# Patient Record
Sex: Female | Born: 1955
Health system: Southern US, Community
[De-identification: ages and names within clinical notes are randomized; demographics above are authoritative.]

## PROBLEM LIST (undated history)

## (undated) DIAGNOSIS — T8859XA Other complications of anesthesia, initial encounter: Secondary | ICD-10-CM

## (undated) DIAGNOSIS — R112 Nausea with vomiting, unspecified: Secondary | ICD-10-CM

## (undated) DIAGNOSIS — Z973 Presence of spectacles and contact lenses: Secondary | ICD-10-CM

## (undated) DIAGNOSIS — F419 Anxiety disorder, unspecified: Secondary | ICD-10-CM

## (undated) DIAGNOSIS — I341 Nonrheumatic mitral (valve) prolapse: Secondary | ICD-10-CM

## (undated) DIAGNOSIS — I10 Essential (primary) hypertension: Secondary | ICD-10-CM

## (undated) DIAGNOSIS — G44009 Cluster headache syndrome, unspecified, not intractable: Secondary | ICD-10-CM

## (undated) DIAGNOSIS — K589 Irritable bowel syndrome without diarrhea: Secondary | ICD-10-CM

## (undated) DIAGNOSIS — C801 Malignant (primary) neoplasm, unspecified: Secondary | ICD-10-CM

## (undated) DIAGNOSIS — M1990 Unspecified osteoarthritis, unspecified site: Secondary | ICD-10-CM

## (undated) DIAGNOSIS — T4145XA Adverse effect of unspecified anesthetic, initial encounter: Secondary | ICD-10-CM

## (undated) DIAGNOSIS — Z9889 Other specified postprocedural states: Secondary | ICD-10-CM

## (undated) HISTORY — DX: Nonrheumatic mitral (valve) prolapse: I34.1

## (undated) HISTORY — DX: Malignant (primary) neoplasm, unspecified: C80.1

## (undated) HISTORY — DX: Presence of spectacles and contact lenses: Z97.3

## (undated) HISTORY — DX: Unspecified osteoarthritis, unspecified site: M19.90

## (undated) HISTORY — DX: Cluster headache syndrome, unspecified, not intractable: G44.009

## (undated) HISTORY — DX: Irritable bowel syndrome without diarrhea: K58.9

---

## 1998-03-07 HISTORY — PX: VAGINAL HYSTERECTOMY: SHX2639

## 1998-03-18 ENCOUNTER — Encounter: Payer: Self-pay | Admitting: Gynecology

## 1998-03-23 ENCOUNTER — Inpatient Hospital Stay (HOSPITAL_COMMUNITY): Admission: RE | Admit: 1998-03-23 | Discharge: 1998-03-24 | Payer: Self-pay | Admitting: Gynecology

## 1998-12-08 ENCOUNTER — Other Ambulatory Visit: Admission: RE | Admit: 1998-12-08 | Discharge: 1998-12-08 | Payer: Self-pay | Admitting: Gynecology

## 1999-05-26 ENCOUNTER — Encounter: Admission: RE | Admit: 1999-05-26 | Discharge: 1999-05-26 | Payer: Self-pay | Admitting: Gynecology

## 1999-05-26 ENCOUNTER — Encounter: Payer: Self-pay | Admitting: Gynecology

## 2000-01-25 ENCOUNTER — Other Ambulatory Visit: Admission: RE | Admit: 2000-01-25 | Discharge: 2000-01-25 | Payer: Self-pay | Admitting: Gynecology

## 2000-08-31 ENCOUNTER — Encounter: Admission: RE | Admit: 2000-08-31 | Discharge: 2000-08-31 | Payer: Self-pay | Admitting: Family Medicine

## 2000-08-31 ENCOUNTER — Encounter: Payer: Self-pay | Admitting: Family Medicine

## 2000-09-29 ENCOUNTER — Encounter: Payer: Self-pay | Admitting: Family Medicine

## 2000-09-29 ENCOUNTER — Encounter: Admission: RE | Admit: 2000-09-29 | Discharge: 2000-09-29 | Payer: Self-pay | Admitting: Family Medicine

## 2000-11-02 ENCOUNTER — Encounter: Admission: RE | Admit: 2000-11-02 | Discharge: 2000-11-02 | Payer: Self-pay | Admitting: Family Medicine

## 2000-11-02 ENCOUNTER — Encounter: Payer: Self-pay | Admitting: Family Medicine

## 2001-02-07 ENCOUNTER — Other Ambulatory Visit: Admission: RE | Admit: 2001-02-07 | Discharge: 2001-02-07 | Payer: Self-pay | Admitting: Gynecology

## 2002-04-29 ENCOUNTER — Encounter: Admission: RE | Admit: 2002-04-29 | Discharge: 2002-04-29 | Payer: Self-pay | Admitting: Family Medicine

## 2002-04-29 ENCOUNTER — Encounter: Payer: Self-pay | Admitting: Family Medicine

## 2002-05-06 ENCOUNTER — Encounter: Payer: Self-pay | Admitting: Family Medicine

## 2002-05-06 ENCOUNTER — Encounter: Admission: RE | Admit: 2002-05-06 | Discharge: 2002-05-06 | Payer: Self-pay | Admitting: Family Medicine

## 2002-08-28 ENCOUNTER — Other Ambulatory Visit: Admission: RE | Admit: 2002-08-28 | Discharge: 2002-08-28 | Payer: Self-pay | Admitting: Gynecology

## 2003-03-08 HISTORY — PX: BREAST LUMPECTOMY: SHX2

## 2003-06-27 ENCOUNTER — Emergency Department (HOSPITAL_COMMUNITY): Admission: EM | Admit: 2003-06-27 | Discharge: 2003-06-27 | Payer: Self-pay | Admitting: Emergency Medicine

## 2003-12-31 ENCOUNTER — Other Ambulatory Visit: Admission: RE | Admit: 2003-12-31 | Discharge: 2003-12-31 | Payer: Self-pay | Admitting: Gynecology

## 2004-01-22 ENCOUNTER — Encounter: Admission: RE | Admit: 2004-01-22 | Discharge: 2004-01-22 | Payer: Self-pay | Admitting: Gynecology

## 2004-02-10 ENCOUNTER — Encounter (INDEPENDENT_AMBULATORY_CARE_PROVIDER_SITE_OTHER): Payer: Self-pay | Admitting: Diagnostic Radiology

## 2004-02-10 ENCOUNTER — Encounter: Admission: RE | Admit: 2004-02-10 | Discharge: 2004-02-10 | Payer: Self-pay | Admitting: Gynecology

## 2004-02-10 ENCOUNTER — Encounter (INDEPENDENT_AMBULATORY_CARE_PROVIDER_SITE_OTHER): Payer: Self-pay | Admitting: Specialist

## 2004-02-13 ENCOUNTER — Encounter (HOSPITAL_COMMUNITY): Admission: RE | Admit: 2004-02-13 | Discharge: 2004-05-13 | Payer: Self-pay | Admitting: General Surgery

## 2004-02-25 ENCOUNTER — Encounter: Admission: RE | Admit: 2004-02-25 | Discharge: 2004-02-25 | Payer: Self-pay | Admitting: General Surgery

## 2004-02-26 ENCOUNTER — Ambulatory Visit (HOSPITAL_COMMUNITY): Admission: RE | Admit: 2004-02-26 | Discharge: 2004-02-26 | Payer: Self-pay | Admitting: General Surgery

## 2004-02-26 ENCOUNTER — Encounter (INDEPENDENT_AMBULATORY_CARE_PROVIDER_SITE_OTHER): Payer: Self-pay | Admitting: Specialist

## 2004-02-26 ENCOUNTER — Ambulatory Visit (HOSPITAL_BASED_OUTPATIENT_CLINIC_OR_DEPARTMENT_OTHER): Admission: RE | Admit: 2004-02-26 | Discharge: 2004-02-26 | Payer: Self-pay | Admitting: General Surgery

## 2004-02-26 ENCOUNTER — Encounter (INDEPENDENT_AMBULATORY_CARE_PROVIDER_SITE_OTHER): Payer: Self-pay | Admitting: General Surgery

## 2004-03-02 ENCOUNTER — Ambulatory Visit: Payer: Self-pay | Admitting: Oncology

## 2004-03-22 ENCOUNTER — Ambulatory Visit (HOSPITAL_BASED_OUTPATIENT_CLINIC_OR_DEPARTMENT_OTHER): Admission: RE | Admit: 2004-03-22 | Discharge: 2004-03-22 | Payer: Self-pay | Admitting: General Surgery

## 2004-03-22 ENCOUNTER — Encounter (INDEPENDENT_AMBULATORY_CARE_PROVIDER_SITE_OTHER): Payer: Self-pay | Admitting: Specialist

## 2004-03-22 ENCOUNTER — Ambulatory Visit (HOSPITAL_COMMUNITY): Admission: RE | Admit: 2004-03-22 | Discharge: 2004-03-22 | Payer: Self-pay | Admitting: General Surgery

## 2004-03-31 ENCOUNTER — Ambulatory Visit (HOSPITAL_COMMUNITY): Admission: RE | Admit: 2004-03-31 | Discharge: 2004-03-31 | Payer: Self-pay | Admitting: Oncology

## 2004-04-07 ENCOUNTER — Ambulatory Visit: Payer: Self-pay | Admitting: Cardiology

## 2004-04-07 ENCOUNTER — Ambulatory Visit (HOSPITAL_COMMUNITY): Admission: RE | Admit: 2004-04-07 | Discharge: 2004-04-07 | Payer: Self-pay | Admitting: Oncology

## 2004-04-07 ENCOUNTER — Encounter: Payer: Self-pay | Admitting: Cardiology

## 2004-04-21 ENCOUNTER — Ambulatory Visit: Payer: Self-pay | Admitting: Oncology

## 2004-06-11 ENCOUNTER — Ambulatory Visit: Payer: Self-pay | Admitting: Oncology

## 2004-08-11 ENCOUNTER — Ambulatory Visit: Admission: RE | Admit: 2004-08-11 | Discharge: 2004-10-28 | Payer: Self-pay | Admitting: Radiation Oncology

## 2004-08-16 ENCOUNTER — Encounter: Admission: RE | Admit: 2004-08-16 | Discharge: 2004-08-16 | Payer: Self-pay | Admitting: Radiation Oncology

## 2004-09-16 ENCOUNTER — Ambulatory Visit: Payer: Self-pay | Admitting: Oncology

## 2004-11-19 ENCOUNTER — Ambulatory Visit (HOSPITAL_BASED_OUTPATIENT_CLINIC_OR_DEPARTMENT_OTHER): Admission: RE | Admit: 2004-11-19 | Discharge: 2004-11-19 | Payer: Self-pay | Admitting: General Surgery

## 2004-12-22 ENCOUNTER — Ambulatory Visit: Payer: Self-pay | Admitting: Oncology

## 2005-01-05 ENCOUNTER — Other Ambulatory Visit: Admission: RE | Admit: 2005-01-05 | Discharge: 2005-01-05 | Payer: Self-pay | Admitting: Gynecology

## 2005-02-24 ENCOUNTER — Ambulatory Visit: Payer: Self-pay | Admitting: Oncology

## 2005-08-11 ENCOUNTER — Ambulatory Visit: Payer: Self-pay | Admitting: Oncology

## 2005-08-15 LAB — CBC WITH DIFFERENTIAL/PLATELET
Eosinophils Absolute: 0.2 10*3/uL (ref 0.0–0.5)
HCT: 40.5 % (ref 34.8–46.6)
LYMPH%: 20.2 % (ref 14.0–48.0)
MONO#: 0.2 10*3/uL (ref 0.1–0.9)
NEUT#: 2.6 10*3/uL (ref 1.5–6.5)
NEUT%: 67.3 % (ref 39.6–76.8)
Platelets: 129 10*3/uL — ABNORMAL LOW (ref 145–400)
WBC: 3.8 10*3/uL — ABNORMAL LOW (ref 3.9–10.0)

## 2005-08-15 LAB — CANCER ANTIGEN 27.29: CA 27.29: 11 U/mL (ref 0–39)

## 2005-08-15 LAB — COMPREHENSIVE METABOLIC PANEL
CO2: 25 mEq/L (ref 19–32)
Creatinine, Ser: 1.02 mg/dL (ref 0.40–1.20)
Glucose, Bld: 104 mg/dL — ABNORMAL HIGH (ref 70–99)
Sodium: 140 mEq/L (ref 135–145)
Total Bilirubin: 0.7 mg/dL (ref 0.3–1.2)
Total Protein: 6.9 g/dL (ref 6.0–8.3)

## 2005-08-18 ENCOUNTER — Encounter: Admission: RE | Admit: 2005-08-18 | Discharge: 2005-08-18 | Payer: Self-pay | Admitting: Oncology

## 2006-01-16 ENCOUNTER — Other Ambulatory Visit: Admission: RE | Admit: 2006-01-16 | Discharge: 2006-01-16 | Payer: Self-pay | Admitting: Gynecology

## 2006-02-09 ENCOUNTER — Ambulatory Visit: Payer: Self-pay | Admitting: Oncology

## 2006-02-13 LAB — CBC WITH DIFFERENTIAL/PLATELET
Eosinophils Absolute: 0.2 10*3/uL (ref 0.0–0.5)
HGB: 13.3 g/dL (ref 11.6–15.9)
LYMPH%: 28.3 % (ref 14.0–48.0)
MONO#: 0.3 10*3/uL (ref 0.1–0.9)
NEUT#: 3 10*3/uL (ref 1.5–6.5)
Platelets: 133 10*3/uL — ABNORMAL LOW (ref 145–400)
RBC: 3.98 10*6/uL (ref 3.70–5.32)
RDW: 12.4 % (ref 11.3–14.5)
WBC: 4.9 10*3/uL (ref 3.9–10.0)

## 2006-02-13 LAB — COMPREHENSIVE METABOLIC PANEL
Albumin: 4.6 g/dL (ref 3.5–5.2)
CO2: 26 mEq/L (ref 19–32)
Glucose, Bld: 96 mg/dL (ref 70–99)
Potassium: 3.8 mEq/L (ref 3.5–5.3)
Sodium: 142 mEq/L (ref 135–145)
Total Protein: 6.9 g/dL (ref 6.0–8.3)

## 2006-02-13 LAB — CANCER ANTIGEN 27.29: CA 27.29: 5 U/mL (ref 0–39)

## 2006-02-15 ENCOUNTER — Ambulatory Visit (HOSPITAL_COMMUNITY): Admission: RE | Admit: 2006-02-15 | Discharge: 2006-02-15 | Payer: Self-pay | Admitting: Oncology

## 2006-08-10 ENCOUNTER — Ambulatory Visit: Payer: Self-pay | Admitting: Oncology

## 2006-08-15 LAB — CBC WITH DIFFERENTIAL/PLATELET
BASO%: 0.3 % (ref 0.0–2.0)
HCT: 40.2 % (ref 34.8–46.6)
MCHC: 36 g/dL (ref 32.0–36.0)
MONO#: 0.3 10*3/uL (ref 0.1–0.9)
NEUT%: 67.2 % (ref 39.6–76.8)
RDW: 12.4 % (ref 11.3–14.5)
WBC: 5.3 10*3/uL (ref 3.9–10.0)
lymph#: 1.3 10*3/uL (ref 0.9–3.3)

## 2006-08-15 LAB — COMPREHENSIVE METABOLIC PANEL
ALT: 16 U/L (ref 0–35)
Albumin: 4.7 g/dL (ref 3.5–5.2)
CO2: 25 mEq/L (ref 19–32)
Calcium: 9.5 mg/dL (ref 8.4–10.5)
Chloride: 106 mEq/L (ref 96–112)
Creatinine, Ser: 0.91 mg/dL (ref 0.40–1.20)
Potassium: 4.1 mEq/L (ref 3.5–5.3)
Sodium: 139 mEq/L (ref 135–145)
Total Protein: 7.1 g/dL (ref 6.0–8.3)

## 2006-08-15 LAB — CANCER ANTIGEN 27.29: CA 27.29: 6 U/mL (ref 0–39)

## 2006-08-21 ENCOUNTER — Encounter: Admission: RE | Admit: 2006-08-21 | Discharge: 2006-08-21 | Payer: Self-pay | Admitting: Gynecology

## 2007-02-09 ENCOUNTER — Ambulatory Visit: Payer: Self-pay | Admitting: Oncology

## 2007-02-13 LAB — COMPREHENSIVE METABOLIC PANEL
ALT: 17 U/L (ref 0–35)
BUN: 12 mg/dL (ref 6–23)
CO2: 25 mEq/L (ref 19–32)
Calcium: 9.2 mg/dL (ref 8.4–10.5)
Chloride: 109 mEq/L (ref 96–112)
Creatinine, Ser: 0.97 mg/dL (ref 0.40–1.20)
Glucose, Bld: 125 mg/dL — ABNORMAL HIGH (ref 70–99)

## 2007-02-13 LAB — CBC WITH DIFFERENTIAL/PLATELET
Basophils Absolute: 0.1 10*3/uL (ref 0.0–0.1)
Eosinophils Absolute: 0.3 10*3/uL (ref 0.0–0.5)
HCT: 37.8 % (ref 34.8–46.6)
HGB: 13.5 g/dL (ref 11.6–15.9)
MONO#: 0.3 10*3/uL (ref 0.1–0.9)
NEUT%: 60.6 % (ref 39.6–76.8)
Platelets: 117 10*3/uL — ABNORMAL LOW (ref 145–400)
WBC: 5.5 10*3/uL (ref 3.9–10.0)
lymph#: 1.5 10*3/uL (ref 0.9–3.3)

## 2007-02-13 LAB — CANCER ANTIGEN 27.29: CA 27.29: 14 U/mL (ref 0–39)

## 2007-02-23 ENCOUNTER — Other Ambulatory Visit: Admission: RE | Admit: 2007-02-23 | Discharge: 2007-02-23 | Payer: Self-pay | Admitting: Gynecology

## 2007-06-12 ENCOUNTER — Ambulatory Visit: Payer: Self-pay | Admitting: Oncology

## 2007-06-14 LAB — CBC WITH DIFFERENTIAL/PLATELET
BASO%: 0.9 % (ref 0.0–2.0)
Basophils Absolute: 0.1 10*3/uL (ref 0.0–0.1)
EOS%: 4.8 % (ref 0.0–7.0)
Eosinophils Absolute: 0.3 10*3/uL (ref 0.0–0.5)
HCT: 40.2 % (ref 34.8–46.6)
HGB: 14.2 g/dL (ref 11.6–15.9)
LYMPH%: 23.7 % (ref 14.0–48.0)
MCH: 33.5 pg (ref 26.0–34.0)
MCHC: 35.3 g/dL (ref 32.0–36.0)
MCV: 95.1 fL (ref 81.0–101.0)
MONO#: 0.4 10*3/uL (ref 0.1–0.9)
MONO%: 7.3 % (ref 0.0–13.0)
NEUT#: 3.9 10*3/uL (ref 1.5–6.5)
NEUT%: 63.3 % (ref 39.6–76.8)
Platelets: 133 10*3/uL — ABNORMAL LOW (ref 145–400)
RBC: 4.23 10*6/uL (ref 3.70–5.32)
RDW: 12.1 % (ref 11.3–14.5)
WBC: 6.1 10*3/uL (ref 3.9–10.0)
lymph#: 1.4 10*3/uL (ref 0.9–3.3)

## 2007-06-15 LAB — COMPREHENSIVE METABOLIC PANEL
AST: 22 U/L (ref 0–37)
Alkaline Phosphatase: 39 U/L (ref 39–117)
BUN: 14 mg/dL (ref 6–23)
Creatinine, Ser: 1.05 mg/dL (ref 0.40–1.20)
Total Bilirubin: 0.6 mg/dL (ref 0.3–1.2)

## 2007-06-15 LAB — VITAMIN D 25 HYDROXY (VIT D DEFICIENCY, FRACTURES): Vit D, 25-Hydroxy: 64 ng/mL (ref 30–89)

## 2007-06-21 LAB — VITAMIN D 1,25 DIHYDROXY: Vit D, 1,25-Dihydroxy: 36 pg/mL (ref 15–75)

## 2007-08-28 ENCOUNTER — Encounter: Admission: RE | Admit: 2007-08-28 | Discharge: 2007-08-28 | Payer: Self-pay | Admitting: Surgery

## 2007-10-04 ENCOUNTER — Ambulatory Visit (HOSPITAL_BASED_OUTPATIENT_CLINIC_OR_DEPARTMENT_OTHER): Admission: RE | Admit: 2007-10-04 | Discharge: 2007-10-04 | Payer: Self-pay | Admitting: Surgery

## 2007-10-04 ENCOUNTER — Encounter (INDEPENDENT_AMBULATORY_CARE_PROVIDER_SITE_OTHER): Payer: Self-pay | Admitting: Surgery

## 2008-02-07 ENCOUNTER — Encounter: Admission: RE | Admit: 2008-02-07 | Discharge: 2008-02-07 | Payer: Self-pay | Admitting: Family Medicine

## 2008-03-28 ENCOUNTER — Encounter: Admission: RE | Admit: 2008-03-28 | Discharge: 2008-03-28 | Payer: Self-pay | Admitting: Oncology

## 2008-06-13 ENCOUNTER — Ambulatory Visit: Payer: Self-pay | Admitting: Oncology

## 2008-06-17 LAB — CBC WITH DIFFERENTIAL/PLATELET
BASO%: 0.7 % (ref 0.0–2.0)
Eosinophils Absolute: 0.1 10*3/uL (ref 0.0–0.5)
HCT: 43.1 % (ref 34.8–46.6)
LYMPH%: 23.4 % (ref 14.0–49.7)
MONO#: 0.2 10*3/uL (ref 0.1–0.9)
NEUT#: 4.3 10*3/uL (ref 1.5–6.5)
NEUT%: 70.9 % (ref 38.4–76.8)
Platelets: 140 10*3/uL — ABNORMAL LOW (ref 145–400)
RBC: 4.5 10*6/uL (ref 3.70–5.45)
WBC: 6 10*3/uL (ref 3.9–10.3)
lymph#: 1.4 10*3/uL (ref 0.9–3.3)

## 2008-06-18 LAB — COMPREHENSIVE METABOLIC PANEL
ALT: 16 U/L (ref 0–35)
CO2: 26 mEq/L (ref 19–32)
Calcium: 9.8 mg/dL (ref 8.4–10.5)
Chloride: 106 mEq/L (ref 96–112)
Glucose, Bld: 94 mg/dL (ref 70–99)
Sodium: 143 mEq/L (ref 135–145)
Total Bilirubin: 0.6 mg/dL (ref 0.3–1.2)
Total Protein: 7.1 g/dL (ref 6.0–8.3)

## 2008-06-18 LAB — CANCER ANTIGEN 27.29: CA 27.29: 18 U/mL (ref 0–39)

## 2008-09-19 ENCOUNTER — Encounter: Admission: RE | Admit: 2008-09-19 | Discharge: 2008-09-19 | Payer: Self-pay | Admitting: Oncology

## 2008-11-24 ENCOUNTER — Ambulatory Visit: Payer: Self-pay | Admitting: Oncology

## 2008-11-26 LAB — RESEARCH LABS

## 2009-05-15 ENCOUNTER — Emergency Department (HOSPITAL_COMMUNITY): Admission: EM | Admit: 2009-05-15 | Discharge: 2009-05-15 | Payer: Self-pay | Admitting: Emergency Medicine

## 2009-09-22 ENCOUNTER — Encounter: Admission: RE | Admit: 2009-09-22 | Discharge: 2009-09-22 | Payer: Self-pay | Admitting: Gynecology

## 2009-10-19 ENCOUNTER — Ambulatory Visit: Payer: Self-pay | Admitting: Oncology

## 2009-10-21 LAB — CBC WITH DIFFERENTIAL/PLATELET
Basophils Absolute: 0.1 10*3/uL (ref 0.0–0.1)
Eosinophils Absolute: 0.2 10*3/uL (ref 0.0–0.5)
HGB: 14.2 g/dL (ref 11.6–15.9)
NEUT#: 4 10*3/uL (ref 1.5–6.5)
RDW: 13 % (ref 11.2–14.5)
lymph#: 1.7 10*3/uL (ref 0.9–3.3)

## 2009-10-22 LAB — COMPREHENSIVE METABOLIC PANEL
Albumin: 4.4 g/dL (ref 3.5–5.2)
BUN: 12 mg/dL (ref 6–23)
Calcium: 9.3 mg/dL (ref 8.4–10.5)
Chloride: 106 mEq/L (ref 96–112)
Glucose, Bld: 115 mg/dL — ABNORMAL HIGH (ref 70–99)
Potassium: 3.8 mEq/L (ref 3.5–5.3)

## 2009-10-22 LAB — VITAMIN D 25 HYDROXY (VIT D DEFICIENCY, FRACTURES): Vit D, 25-Hydroxy: 60 ng/mL (ref 30–89)

## 2009-10-29 ENCOUNTER — Ambulatory Visit (HOSPITAL_COMMUNITY): Admission: RE | Admit: 2009-10-29 | Discharge: 2009-10-29 | Payer: Self-pay | Admitting: Oncology

## 2009-10-30 ENCOUNTER — Ambulatory Visit (HOSPITAL_COMMUNITY): Admission: RE | Admit: 2009-10-30 | Discharge: 2009-10-30 | Payer: Self-pay | Admitting: Oncology

## 2010-01-15 ENCOUNTER — Ambulatory Visit (HOSPITAL_BASED_OUTPATIENT_CLINIC_OR_DEPARTMENT_OTHER): Payer: 59 | Admitting: Oncology

## 2010-01-19 ENCOUNTER — Ambulatory Visit (HOSPITAL_COMMUNITY): Admission: RE | Admit: 2010-01-19 | Discharge: 2010-01-19 | Payer: Self-pay | Admitting: Oncology

## 2010-01-19 LAB — COMPREHENSIVE METABOLIC PANEL
ALT: 23 U/L (ref 0–35)
Alkaline Phosphatase: 49 U/L (ref 39–117)
Glucose, Bld: 80 mg/dL (ref 70–99)
Sodium: 141 mEq/L (ref 135–145)
Total Bilirubin: 0.6 mg/dL (ref 0.3–1.2)
Total Protein: 6.9 g/dL (ref 6.0–8.3)

## 2010-03-28 ENCOUNTER — Encounter: Payer: Self-pay | Admitting: Emergency Medicine

## 2010-04-09 ENCOUNTER — Encounter: Payer: 59 | Admitting: Oncology

## 2010-04-09 DIAGNOSIS — C50219 Malignant neoplasm of upper-inner quadrant of unspecified female breast: Secondary | ICD-10-CM

## 2010-04-09 LAB — COMPREHENSIVE METABOLIC PANEL
ALT: 17 U/L (ref 0–35)
AST: 24 U/L (ref 0–37)
Calcium: 9.5 mg/dL (ref 8.4–10.5)
Chloride: 105 mEq/L (ref 96–112)
Creatinine, Ser: 0.89 mg/dL (ref 0.40–1.20)
Potassium: 3.9 mEq/L (ref 3.5–5.3)
Sodium: 139 mEq/L (ref 135–145)
Total Protein: 6.7 g/dL (ref 6.0–8.3)

## 2010-04-09 LAB — CBC WITH DIFFERENTIAL/PLATELET
BASO%: 0.6 % (ref 0.0–2.0)
EOS%: 3.7 % (ref 0.0–7.0)
MCH: 33.3 pg (ref 25.1–34.0)
MCHC: 34.4 g/dL (ref 31.5–36.0)
NEUT%: 73.4 % (ref 38.4–76.8)
RBC: 4.11 10*6/uL (ref 3.70–5.45)
RDW: 13 % (ref 11.2–14.5)
WBC: 7.5 10*3/uL (ref 3.9–10.3)
lymph#: 1.3 10*3/uL (ref 0.9–3.3)

## 2010-04-14 ENCOUNTER — Other Ambulatory Visit: Payer: Self-pay | Admitting: Oncology

## 2010-04-14 ENCOUNTER — Encounter (HOSPITAL_BASED_OUTPATIENT_CLINIC_OR_DEPARTMENT_OTHER): Payer: 59 | Admitting: Oncology

## 2010-04-14 DIAGNOSIS — J984 Other disorders of lung: Secondary | ICD-10-CM

## 2010-04-14 DIAGNOSIS — C50219 Malignant neoplasm of upper-inner quadrant of unspecified female breast: Secondary | ICD-10-CM

## 2010-04-14 DIAGNOSIS — Z170 Estrogen receptor positive status [ER+]: Secondary | ICD-10-CM

## 2010-04-14 LAB — URINALYSIS, MICROSCOPIC - CHCC
Bilirubin (Urine): NEGATIVE
Blood: NEGATIVE
Ketones: NEGATIVE mg/dL
pH: 5 (ref 4.6–8.0)

## 2010-04-17 LAB — URINE CULTURE

## 2010-05-30 LAB — COMPREHENSIVE METABOLIC PANEL WITH GFR
ALT: 22 U/L (ref 0–35)
AST: 32 U/L (ref 0–37)
Alkaline Phosphatase: 37 U/L — ABNORMAL LOW (ref 39–117)
Calcium: 9 mg/dL (ref 8.4–10.5)
GFR calc Af Amer: >60 mL/min (ref >60)
Potassium: 3.8 meq/L (ref 3.5–5.1)
Sodium: 140 meq/L (ref 135–145)
Total Protein: 6 g/dL (ref 6.0–8.3)

## 2010-05-30 LAB — DIFFERENTIAL
Basophils Absolute: 0.1 10*3/uL (ref 0.0–0.1)
Basophils Relative: 1 % (ref 0–1)
Eosinophils Absolute: 0.1 K/uL (ref 0.0–0.7)
Eosinophils Relative: 3 % (ref 0–5)
Lymphocytes Relative: 27 % (ref 12–46)
Lymphs Abs: 1.5 K/uL (ref 0.7–4.0)
Monocytes Absolute: 0.3 K/uL (ref 0.1–1.0)
Monocytes Relative: 6 % (ref 3–12)
Neutro Abs: 3.5 10*3/uL (ref 1.7–7.7)
Neutrophils Relative %: 64 % (ref 43–77)

## 2010-05-30 LAB — COMPREHENSIVE METABOLIC PANEL
Albumin: 3.8 g/dL (ref 3.5–5.2)
BUN: 7 mg/dL (ref 6–23)
CO2: 29 mEq/L (ref 19–32)
Chloride: 108 mEq/L (ref 96–112)
Creatinine, Ser: 0.75 mg/dL (ref 0.4–1.2)
GFR calc non Af Amer: >60 mL/min (ref >60)
Glucose, Bld: 87 mg/dL (ref 70–99)
Total Bilirubin: 0.6 mg/dL (ref 0.3–1.2)

## 2010-05-30 LAB — POCT CARDIAC MARKERS
CKMB, poc: 1 ng/mL (ref 1.0–8.0)
Troponin i, poc: <0.05 ng/mL (ref 0.00–0.09)

## 2010-05-30 LAB — PROTIME-INR
INR: 0.99 (ref 0.00–1.49)
Prothrombin Time: 13 seconds (ref 11.6–15.2)

## 2010-05-30 LAB — CBC
HCT: 38.4 % (ref 36.0–46.0)
Hemoglobin: 13.4 g/dL (ref 12.0–15.0)
MCHC: 34.8 g/dL (ref 30.0–36.0)
MCV: 98.4 fL (ref 78.0–100.0)
Platelets: 100 10*3/uL — ABNORMAL LOW (ref 150–400)
RBC: 3.9 MIL/uL (ref 3.87–5.11)
RDW: 13.1 % (ref 11.5–15.5)
WBC: 5.4 10*3/uL (ref 4.0–10.5)

## 2010-05-30 LAB — APTT: aPTT: 25 seconds (ref 24–37)

## 2010-07-20 NOTE — Op Note (Signed)
Kristin Adams, Kristin Adams                  ACCOUNT NO.:  [id number]   MEDICAL RECORD NO.:  [id number]          PATIENT TYPE:  AMB   LOCATION:  DSC                          FACILITY:  [facility]   PHYSICIAN:  Currie Paris, M.D.DATE OF BIRTH:  26-Feb-1956   DATE OF PROCEDURE:  10/04/2007  DATE OF DISCHARGE:                               OPERATIVE REPORT   PREOPERATIVE DIAGNOSIS:  Nevus with atypia at the left anterior chest  wall.   POSTOPERATIVE DIAGNOSIS:  Nevus with atypia at the left anterior chest  wall.   OPERATION:  Re-excision of a nevus, left anterior chest wall.   SURGEON:  Currie Paris, MD   ANESTHESIA:  Local.   CLINICAL HISTORY:  This is a 52-year lady who had a nevus removed from  the anterior chest wall with some atypia and it was soft.  I did re-  excision for better margins.   DESCRIPTION OF PROCEDURE:  The lesion was noted by the patient in the  holding area and marked by me.   She was taken to the operating room and the area was prepped.  The time-  out was done.  The area was anesthetized with 1% Xylocaine with epi  using about 10 mL.   I waited about 10 minutes then we reprepped with some Betadine.  I made  an elliptical incision transversely oriented, trying to get at least a 2-  mm margin in all directions around the 1-cm x 1.2-cm lesion.   The area was excised including some subcutaneous tissue.   The incision was then closed in layers with 3-0 Vicryl, 4-0 Monocryl  subcuticular, and Dermabond.   The patient tolerated the procedure well and there were no  complications.      Currie Paris, M.D.  Electronically Signed     CJS/MEDQ  D:  10/04/2007  T:  10/04/2007  Job:  [id number]

## 2010-08-06 ENCOUNTER — Encounter (INDEPENDENT_AMBULATORY_CARE_PROVIDER_SITE_OTHER): Payer: Self-pay | Admitting: Surgery

## 2010-08-23 ENCOUNTER — Other Ambulatory Visit: Payer: Self-pay | Admitting: Gynecology

## 2010-08-23 DIAGNOSIS — Z853 Personal history of malignant neoplasm of breast: Secondary | ICD-10-CM

## 2010-08-23 DIAGNOSIS — Z9889 Other specified postprocedural states: Secondary | ICD-10-CM

## 2010-09-29 ENCOUNTER — Ambulatory Visit
Admission: RE | Admit: 2010-09-29 | Discharge: 2010-09-29 | Disposition: A | Discharge status: Home / Self Care | Payer: 59 | Source: Ambulatory Visit | Attending: Gynecology | Admitting: Gynecology

## 2010-09-29 DIAGNOSIS — Z9889 Other specified postprocedural states: Secondary | ICD-10-CM

## 2010-09-29 DIAGNOSIS — Z853 Personal history of malignant neoplasm of breast: Secondary | ICD-10-CM

## 2010-10-27 ENCOUNTER — Encounter (INDEPENDENT_AMBULATORY_CARE_PROVIDER_SITE_OTHER): Payer: Self-pay | Admitting: Surgery

## 2011-01-07 ENCOUNTER — Encounter (INDEPENDENT_AMBULATORY_CARE_PROVIDER_SITE_OTHER): Payer: Self-pay | Admitting: General Surgery

## 2011-01-07 ENCOUNTER — Ambulatory Visit (INDEPENDENT_AMBULATORY_CARE_PROVIDER_SITE_OTHER): Payer: 59 | Admitting: Surgery

## 2011-01-07 ENCOUNTER — Encounter (INDEPENDENT_AMBULATORY_CARE_PROVIDER_SITE_OTHER): Payer: Self-pay | Admitting: Surgery

## 2011-01-07 VITALS — BP 128/82 | HR 70 | Temp 97.2°F | Resp 16 | Ht 65.0 in | Wt 159.4 lb

## 2011-01-07 DIAGNOSIS — Z853 Personal history of malignant neoplasm of breast: Secondary | ICD-10-CM | POA: Insufficient documentation

## 2011-01-07 NOTE — Progress Notes (Signed)
NAME: Kristin Adams       DOB: 10/29/1955           DATE: 01/07/2011       MRN: [id number]   PERNIE GROSSO is a 55 y.o.Marland Kitchenfemale who presents for routine followup of her Left breast cancer diagnosed in 2005 and treated with lumpectomy and radiation and chemo. She has no problems or concerns on either side.She continues to have some pain in the left axilla  PFSH: She has had no significant changes since the last visit here.  ROS: There have been no significant changes since the last visit here.She had a scare last year with a scan that suggested metatstatic nodules in the lung but F/U scan was ok and it was likely inflammatory  EXAM: General: The patient is alert, oriented, generally healty appearing, NAD. Mood and affect are normal.  Breasts:  Right breast is normal. Left shows mild radiation changes and some firmness at the lumpectomy site. No evidence of recurrence  Lymphatics: She has no axillary or supraclavicular adenopathy on either side.  Extremities: Full ROM of the surgical side with no lymphedema noted.  Data Reviewed: Mammogram in June was negative  Impression: Doing well, with no evidence of recurrent cancer or new cancer  Plan: RTC PRN

## 2011-03-22 ENCOUNTER — Other Ambulatory Visit: Payer: Self-pay | Admitting: Gynecology

## 2011-04-07 ENCOUNTER — Other Ambulatory Visit (HOSPITAL_BASED_OUTPATIENT_CLINIC_OR_DEPARTMENT_OTHER): Payer: 59 | Admitting: Lab

## 2011-04-07 DIAGNOSIS — J984 Other disorders of lung: Secondary | ICD-10-CM

## 2011-04-07 DIAGNOSIS — Z170 Estrogen receptor positive status [ER+]: Secondary | ICD-10-CM

## 2011-04-07 DIAGNOSIS — C50219 Malignant neoplasm of upper-inner quadrant of unspecified female breast: Secondary | ICD-10-CM

## 2011-04-07 LAB — CBC WITH DIFFERENTIAL/PLATELET
BASO%: 1 % (ref 0.0–2.0)
Basophils Absolute: 0.1 10*3/uL (ref 0.0–0.1)
EOS%: 4.9 % (ref 0.0–7.0)
HGB: 14.6 g/dL (ref 11.6–15.9)
MCH: 32.9 pg (ref 25.1–34.0)
NEUT#: 3.8 10*3/uL (ref 1.5–6.5)
RDW: 13.1 % (ref 11.2–14.5)
WBC: 6.6 10*3/uL (ref 3.9–10.3)
lymph#: 2 10*3/uL (ref 0.9–3.3)

## 2011-04-08 LAB — COMPREHENSIVE METABOLIC PANEL
AST: 22 U/L (ref 0–37)
Albumin: 4.7 g/dL (ref 3.5–5.2)
Alkaline Phosphatase: 72 U/L (ref 39–117)
Glucose, Bld: 113 mg/dL — ABNORMAL HIGH (ref 70–99)
Potassium: 3.9 mEq/L (ref 3.5–5.3)
Sodium: 139 mEq/L (ref 135–145)
Total Protein: 6.8 g/dL (ref 6.0–8.3)

## 2011-04-08 LAB — VITAMIN D 25 HYDROXY (VIT D DEFICIENCY, FRACTURES): Vit D, 25-Hydroxy: 65 ng/mL (ref 30–89)

## 2011-04-25 ENCOUNTER — Telehealth: Payer: Self-pay | Admitting: Oncology

## 2011-04-25 ENCOUNTER — Ambulatory Visit (HOSPITAL_BASED_OUTPATIENT_CLINIC_OR_DEPARTMENT_OTHER): Payer: 59 | Admitting: Oncology

## 2011-04-25 VITALS — BP 135/85 | HR 68 | Temp 98.3°F | Ht 65.0 in | Wt 161.3 lb

## 2011-04-25 DIAGNOSIS — Z853 Personal history of malignant neoplasm of breast: Secondary | ICD-10-CM

## 2011-04-25 MED ORDER — TAMOXIFEN CITRATE 20 MG PO TABS: 20.0000 mg | ORAL_TABLET | Freq: Two times a day (BID) | ORAL | Status: DC

## 2011-04-25 NOTE — Telephone Encounter (Signed)
gve the pt her feb 2014 appt calendar °

## 2011-04-25 NOTE — Progress Notes (Signed)
ID: Kristin Adams   DOB: [birth year]  MR#: [id number]  CSN#:[id number]  HISTORY OF PRESENT ILLNESS: The patient had a screening mammogram at [facility] on 01-22-04.  The prior mammogram had been in August 2002.  This new screening mammogram showed a possible left breast mass, and the patient was brought back for additional studies on 02-10-04.  Spot compression views confirmed the presence of a spiculated mass in the 11:00 position of the left breast which could be palpated once it had been located by mammography.  Ultrasound showed a spiculated solid mass measuring up to 1.3 cm. The axilla on the left was unremarkable.  Accordingly, the patient proceeded to biopsy under ultrasound guidance on the same day, 02-10-04, and this showed ([id number]) an invasive mammary carcinoma which was ER positive at 90%, PR positive at 94%, and HER-2/neu negative by FISH.  With this information, the patient was referred to Dr. Maple Adams and, after appropriate discussion, she underwent left lumpectomy with sentinel lymph node biopsy on 02-26-04.  The final pathology there ([id number]) showed one of the two sentinel lymph nodes to be positive for metastatic disease (greater than 2 mm.).  The mass itself measured 1.6 cm., it was Grade 2, margins were adequate, the patient accordingly staging as T1, N1.  With this information, she is referred here for further evaluation and treatment.    INTERVAL HISTORY: She just returned from a trip to [place], and is tanned "all over". During the trip she feels she has more energy and her mind worked better. She thinks she is under a lot of stress at work and that explains any other problems. Unfortunately she continues to smoke, less than a pack per day. Dr. Nicholas Adams recently started her on Wellbutrin. Her pharmacist raised the question about this but I reassured her of the entire issue went away a couple of years ago with a major presentation at [facility] (the article has since been  published)  REVIEW OF SYSTEMS: She has moderate insomnia. This is not a new finding. She has some low back and joint pain here and there but nothing that is more intense or frequent. She feels a good bit more forgetful than before. She feels tired all the time. Overall however a detailed review of systems was Adams with no symptoms suggestive of disease recurrence  PAST MEDICAL HISTORY: Past Medical History  Diagnosis Date  . IBS (irritable bowel syndrome)   . Headaches, cluster   . Wears glasses   . Cancer     Breast Cancer  . Arthritis   . Mitral valve prolapse   Significant for migraines, history of mitral valve prolapse (the patient usually taking antibiotics prior to procedures), status post total abdominal hysterectomy with bilateral salpingo-oophorectomy in January 2000 for endometriosis, and status post right benign fibroadenoma removal in 1975  PAST SURGICAL HISTORY: Past Surgical History  Procedure Date  . Vaginal hysterectomy 2000  . Breast lumpectomy 2005    FAMILY HISTORY Family History  Problem Relation Age of Onset  . Heart disease Father   . Cancer Mother     colon  . Diabetes Brother   . Heart disease Brother   . Diabetes Sister   . Kidney disease Sister   The patient's father died at the age of 54 from congestive heart failure.  The patient's mother is alive at age 38, status post nephrectomy secondary to kidney stones.  The patient has two brothers and two sisters.  There is no history of  ovarian or breast cancer in the family.    GYNECOLOGIC HISTORY She is G1, P1, first pregnancy age 37.  She took estrogens in the form of Femring until this diagnosis.  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  Her husband, Kristin Adams, is a Curator with the [employer].  heir daughter, Kristin Adams, married a Dentist last year and will be living the patient's first grandchild in about 3 months.The patient is not a church  attender.     ADVANCED DIRECTIVES: in place  HEALTH MAINTENANCE: History  Substance Use Topics  . Smoking status: Current Everyday Smoker -- 0.5 packs/day for 38 years    Types: Cigarettes  . Smokeless tobacco: Not on file  . Alcohol Use: 1.5 oz/week    3 drink(s) per week     Allergies  Allergen Reactions  . Corticosteroids     Current Outpatient Prescriptions  Medication Sig Dispense Refill  . ALPRAZolam (XANAX) 0.5 MG tablet Take 0.5 mg by mouth at bedtime as needed.      . B Complex-C (SUPER B COMPLEX PO) Take by mouth daily.        . Calcium Carbonate-Vitamin D (CALCIUM + D PO) Take 1,200 mg by mouth daily.        . Cholecalciferol (VITAMIN D) 1000 UNITS capsule Take 1,000 Units by mouth daily.        Marland Kitchen escitalopram (LEXAPRO) 20 MG tablet Take 20 mg by mouth daily.        . metoprolol (TOPROL-XL) 50 MG 24 hr tablet Take 50 mg by mouth daily.        . Multiple Vitamin (MULTIVITAMIN PO) Take by mouth daily.        . tamoxifen (NOLVADEX) 20 MG tablet Take 20 mg by mouth 2 (two) times daily.        . vitamin C (ASCORBIC ACID) 500 MG tablet Take 500 mg by mouth daily.        . Zinc Sulfate (ZINC 15 PO) Take by mouth as needed.        . Omega-3 Fatty Acids (FISH OIL) 1000 MG CAPS Take by mouth daily.        . rizatriptan (MAXALT) 10 MG tablet Take 10 mg by mouth as needed. May repeat in 2 hours if needed       . Vitamin E (VITA-PLUS E PO) Take by mouth as needed.          OBJECTIVE: Middle-aged white woman who seems slightly anxious Filed Vitals:   04/25/11 0933  BP: 135/85  Pulse: 68  Temp: 98.3 F (36.8 C)     Body mass index is 26.84 kg/(m^2).    ECOG FS: 0  Sclerae unicteric Oropharynx clear No peripheral adenopathy Lungs no rales or rhonchi Heart regular rate and rhythm Abd benign MSK no focal spinal tenderness, no peripheral edema Neuro: nonfocal Breasts: Right breast no suspicious findings. Left breast status post lumpectomy and radiation. No evidence of  local recurrence  LAB RESULTS: Lab Results  Component Value Date   WBC 6.6 04/07/2011   NEUTROABS 3.8 04/07/2011   HGB 14.6 04/07/2011   HCT 43.1 04/07/2011   MCV 97.1 04/07/2011   PLT 138* 04/07/2011      Chemistry      Component Value Date/Time   NA 139 04/07/2011 1402   K 3.9 04/07/2011 1402   CL 102 04/07/2011 1402   CO2 26 04/07/2011 1402   BUN 10 04/07/2011 1402  CREATININE 0.87 04/07/2011 1402      Component Value Date/Time   CALCIUM 9.7 04/07/2011 1402   ALKPHOS 72 04/07/2011 1402   AST 22 04/07/2011 1402   ALT 14 04/07/2011 1402   BILITOT 0.6 04/07/2011 1402       Lab Results  Component Value Date   LABCA2 14 04/07/2011    No results found for this basename: INR:1;PROTIME:1 in the last 168 hours  No results found for this basename: UACOL:1,UAPR:1,USPG:1,UPH:1,UTP:1,UGL:1,UKET:1,UBIL:1,UHGB:1,UNIT:1,UROB:1,ULEU:1,UEPI:1,UWBC:1,URBC:1,UBAC:1,CAST:1,CRYS:1,UCOM:1,BILUA:1 in the last 72 hours   STUDIES:  She had a bone density at Dr. Johnn Hai which shows very slight decline per his reading. Repeat mammography due July 2013   ASSESSMENT:  56 year-old [place] woman status post left lumpectomy and sentinel lymph node biopsy December of 2005 for a T1c N1 Grade 2 invasive ductal carcinoma, estrogen and progesterone receptor positive, HER2 negative, treated adjuvantly with doxorubicin and cyclophosphamide x 4 followed by paclitaxel x 6 completed in May of 2006 followed by radiation completed in August of 2006, followed by 5 years of tamoxifen completed in August of 2011.   PLAN: She has been unable to tolerate aromatase inhibitors and today we again reviewed her risk profile. The Adjuvant! Program would put her at risk of recurrence at this point in the 12% range she can decrease it biopsy 25% with aromatase inhibitors and perhaps a bit less by taking tamoxifen. We do now have dated to continue tamoxifen up to 10 years. After much discussion what she wants to do is take tamoxifen for  an additional 2 years or until December of 2015, and then "get out of the cancer business". This will also obviate any concerns regarding her going back on her femring.  I strongly recommended she discontinue smoking. I think this is more of a risk for her overall and I also strongly recommended she increase her exercise program from 10 minutes a day to 45 minutes a day. This would help her mood, bones, heart, weight, and also decrease your risk of developing new cancer.  She is going to see [place] again in one year. She knows to call for any problems that may develop before the next visit   Jurline Folger C    04/25/2011

## 2011-10-04 ENCOUNTER — Other Ambulatory Visit: Payer: Self-pay | Admitting: Gynecology

## 2011-10-04 DIAGNOSIS — Z1231 Encounter for screening mammogram for malignant neoplasm of breast: Secondary | ICD-10-CM

## 2011-10-19 ENCOUNTER — Ambulatory Visit
Admission: RE | Admit: 2011-10-19 | Discharge: 2011-10-19 | Disposition: A | Discharge status: Home / Self Care | Payer: 59 | Source: Ambulatory Visit | Attending: Gynecology | Admitting: Gynecology

## 2011-10-19 DIAGNOSIS — Z1231 Encounter for screening mammogram for malignant neoplasm of breast: Secondary | ICD-10-CM

## 2012-02-13 ENCOUNTER — Telehealth: Payer: Self-pay | Admitting: Oncology

## 2012-02-13 NOTE — Telephone Encounter (Signed)
Pt called and moved 04/23/12 f/u to 05/14/12 due to she will be out of town. Pt will keep 04/16/12 lb and has new d/t for f/u.

## 2012-03-30 ENCOUNTER — Other Ambulatory Visit: Payer: Self-pay | Admitting: Gynecology

## 2012-04-04 ENCOUNTER — Other Ambulatory Visit: Payer: Self-pay | Admitting: Gynecology

## 2012-04-04 DIAGNOSIS — Z87891 Personal history of nicotine dependence: Secondary | ICD-10-CM

## 2012-04-13 ENCOUNTER — Ambulatory Visit
Admission: RE | Admit: 2012-04-13 | Discharge: 2012-04-13 | Disposition: A | Discharge status: Home / Self Care | Payer: 59 | Source: Ambulatory Visit | Attending: Gynecology | Admitting: Gynecology

## 2012-04-13 DIAGNOSIS — Z87891 Personal history of nicotine dependence: Secondary | ICD-10-CM

## 2012-04-16 ENCOUNTER — Other Ambulatory Visit (HOSPITAL_BASED_OUTPATIENT_CLINIC_OR_DEPARTMENT_OTHER): Payer: 59 | Admitting: Lab

## 2012-04-16 DIAGNOSIS — Z853 Personal history of malignant neoplasm of breast: Secondary | ICD-10-CM

## 2012-04-16 LAB — COMPREHENSIVE METABOLIC PANEL (CC13)
AST: 22 U/L (ref 5–34)
Albumin: 3.9 g/dL (ref 3.5–5.0)
Alkaline Phosphatase: 69 U/L (ref 40–150)
BUN: 9.9 mg/dL (ref 7.0–26.0)
Creatinine: 1.2 mg/dL — ABNORMAL HIGH (ref 0.6–1.1)
Glucose: 80 mg/dl (ref 70–99)
Potassium: 3.7 mEq/L (ref 3.5–5.1)
Total Bilirubin: 0.61 mg/dL (ref 0.20–1.20)

## 2012-04-16 LAB — CBC WITH DIFFERENTIAL/PLATELET
BASO%: 3 % — ABNORMAL HIGH (ref 0.0–2.0)
EOS%: 3 % (ref 0.0–7.0)
HCT: 41.6 % (ref 34.8–46.6)
LYMPH%: 30.5 % (ref 14.0–49.7)
MCH: 32.5 pg (ref 25.1–34.0)
MCHC: 33.8 g/dL (ref 31.5–36.0)
MONO%: 5.2 % (ref 0.0–14.0)
NEUT%: 58.3 % (ref 38.4–76.8)
Platelets: 146 10*3/uL (ref 145–400)
RBC: 4.33 10*6/uL (ref 3.70–5.45)
WBC: 6.3 10*3/uL (ref 3.9–10.3)

## 2012-04-17 LAB — CANCER ANTIGEN 27.29: CA 27.29: 18 U/mL (ref 0–39)

## 2012-04-23 ENCOUNTER — Ambulatory Visit: Payer: 59 | Admitting: Oncology

## 2012-05-14 ENCOUNTER — Telehealth: Payer: Self-pay | Admitting: Oncology

## 2012-05-14 ENCOUNTER — Ambulatory Visit (HOSPITAL_COMMUNITY)
Admission: RE | Admit: 2012-05-14 | Discharge: 2012-05-14 | Disposition: A | Discharge status: Home / Self Care | Payer: 59 | Source: Ambulatory Visit | Attending: Oncology | Admitting: Oncology

## 2012-05-14 ENCOUNTER — Ambulatory Visit (HOSPITAL_BASED_OUTPATIENT_CLINIC_OR_DEPARTMENT_OTHER): Payer: 59 | Admitting: Oncology

## 2012-05-14 VITALS — BP 127/61 | HR 80 | Temp 98.5°F | Resp 20 | Ht 65.0 in | Wt 154.9 lb

## 2012-05-14 DIAGNOSIS — Z853 Personal history of malignant neoplasm of breast: Secondary | ICD-10-CM

## 2012-05-14 DIAGNOSIS — M79609 Pain in unspecified limb: Secondary | ICD-10-CM | POA: Insufficient documentation

## 2012-05-14 NOTE — Telephone Encounter (Signed)
Pt sent to [facility] for xray. Per 3/10 pof no f/u needed.

## 2012-05-14 NOTE — Progress Notes (Signed)
ID: Audie Clear   DOB: 1955-05-26  MR#: [id number]  CSN#:[id number]  PCP: Lupita Raider, MD   HISTORY OF PRESENT ILLNESS: The patient had a screening mammogram at [facility] on 01-22-04.  The prior mammogram had been in August 2002.  This new screening mammogram showed a possible left breast mass, and the patient was brought back for additional studies on 02-10-04.  Spot compression views confirmed the presence of a spiculated mass in the 11:00 position of the left breast which could be palpated once it had been located by mammography.  Ultrasound showed a spiculated solid mass measuring up to 1.3 cm. The axilla on the left was unremarkable.  Accordingly, the patient proceeded to biopsy under ultrasound guidance on the same day, 02-10-04, and this showed ([id number]) an invasive mammary carcinoma which was ER positive at 90%, PR positive at 94%, and HER-2/neu negative by FISH.  With this information, the patient was referred to Dr. Maple Hudson and, after appropriate discussion, she underwent left lumpectomy with sentinel lymph node biopsy on 02-26-04.  The final pathology there ([id number]) showed one of the two sentinel lymph nodes to be positive for metastatic disease (greater than 2 mm.).  The mass itself measured 1.6 cm., it was Grade 2, margins were adequate, the patient accordingly staging as T1, N1.  With this information, she is referred here for further evaluation and treatment.    INTERVAL HISTORY: She just returned from a trip to [place]. Otherwise the interval history significant for her never having resumed her tamoxifen. She continues to try to quit smoking, and is now to "less than half a pack per day". She is using some Nicorette to help. We went over her options regarding that once again. Family is doing fine. She is exercising about 15 minutes a day.  REVIEW OF SYSTEMS: Otherwise a review of systems today was entirely negative. She had mild adenopathy after returning from her trip, but  that appears to have completely resolved. She is having some right thigh bone pain. This is a bit persistent, relatively new, perhaps a few weeks. He does not make her limp or otherwise limit her activities. A detailed review of systems was otherwise noncontributory.  PAST MEDICAL HISTORY: Past Medical History  Diagnosis Date  . IBS (irritable bowel syndrome)   . Headaches, cluster   . Wears glasses   . Cancer     Breast Cancer  . Arthritis   . Mitral valve prolapse   Significant for migraines, history of mitral valve prolapse (the patient usually taking antibiotics prior to procedures), status post total abdominal hysterectomy with bilateral salpingo-oophorectomy in January 2000 for endometriosis, and status post right benign fibroadenoma removal in 1975  PAST SURGICAL HISTORY: Past Surgical History  Procedure Laterality Date  . Vaginal hysterectomy  2000  . Breast lumpectomy  2005    FAMILY HISTORY Family History  Problem Relation Age of Onset  . Heart disease Father   . Cancer Mother     colon  . Diabetes Brother   . Heart disease Brother   . Diabetes Sister   . Kidney disease Sister   The patient's father died at the age of 36 from congestive heart failure.  The patient's mother is alive at age 2, status post nephrectomy secondary to kidney stones.  The patient has two brothers and two sisters.  There is no history of ovarian or breast cancer in the family.    GYNECOLOGIC HISTORY She is G1, P1, first pregnancy age 25.  She took estrogens in the form of Femring until this diagnosis.  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  Her husband, Annette Stable, is a Curator with the [employer].  Their daughter, Florentina Addison, married a Dentist. The patient's first grandchild is currently 83 months old. The patient is not a church attender.     ADVANCED DIRECTIVES: in place  HEALTH MAINTENANCE: History  Substance Use Topics  . Smoking  status: Current Every Day Smoker -- 0.50 packs/day for 38 years    Types: Cigarettes  . Smokeless tobacco: Not on file  . Alcohol Use: 1.5 oz/week    3 drink(s) per week     Allergies  Allergen Reactions  . Corticosteroids     Current Outpatient Prescriptions  Medication Sig Dispense Refill  . ALPRAZolam (XANAX) 0.5 MG tablet Take 0.5 mg by mouth at bedtime as needed.      . B Complex-C (SUPER B COMPLEX PO) Take by mouth daily.        . Calcium Carbonate-Vitamin D (CALCIUM + D PO) Take 1,200 mg by mouth daily.        . Cholecalciferol (VITAMIN D) 1000 UNITS capsule Take 1,000 Units by mouth daily.        Marland Kitchen escitalopram (LEXAPRO) 20 MG tablet Take 20 mg by mouth daily.        . metoprolol (TOPROL-XL) 50 MG 24 hr tablet Take 50 mg by mouth daily.        . Multiple Vitamin (MULTIVITAMIN PO) Take by mouth daily.        . Omega-3 Fatty Acids (FISH OIL) 1000 MG CAPS Take by mouth daily.        . rizatriptan (MAXALT) 10 MG tablet Take 10 mg by mouth as needed. May repeat in 2 hours if needed       . tamoxifen (NOLVADEX) 20 MG tablet Take 1 tablet (20 mg total) by mouth 2 (two) times daily.  90 tablet  12  . vitamin C (ASCORBIC ACID) 500 MG tablet Take 500 mg by mouth daily.        . Vitamin E (VITA-PLUS E PO) Take by mouth as needed.        . Zinc Sulfate (ZINC 15 PO) Take by mouth as needed.         No current facility-administered medications for this visit.    OBJECTIVE: Middle-aged white woman who appears well Filed Vitals:   05/14/12 1319  BP: 127/61  Pulse: 80  Temp: 98.5 F (36.9 C)  Resp: 20     Body mass index is 25.78 kg/(m^2).    ECOG FS: 0  Sclerae unicteric Oropharynx clear No peripheral adenopathy Lungs no rales or rhonchi Heart regular rate and rhythm Abd benign MSK no focal spinal tenderness, no peripheral edema Neuro: nonfocal Breasts: Right breast no suspicious findings. Left breast status post lumpectomy and radiation. No evidence of local  recurrence  LAB RESULTS: Lab Results  Component Value Date   WBC 6.3 04/16/2012   NEUTROABS 3.7 04/16/2012   HGB 14.1 04/16/2012   HCT 41.6 04/16/2012   MCV 96.1 04/16/2012   PLT 146 04/16/2012      Chemistry      Component Value Date/Time   NA 142 04/16/2012 1410   NA 139 04/07/2011 1402   K 3.7 04/16/2012 1410   K 3.9 04/07/2011 1402   CL 106 04/16/2012 1410   CL 102 04/07/2011 1402   CO2 29 04/16/2012  1410   CO2 26 04/07/2011 1402   BUN 9.9 04/16/2012 1410   BUN 10 04/07/2011 1402   CREATININE 1.2* 04/16/2012 1410   CREATININE 0.87 04/07/2011 1402      Component Value Date/Time   CALCIUM 9.4 04/16/2012 1410   CALCIUM 9.7 04/07/2011 1402   ALKPHOS 69 04/16/2012 1410   ALKPHOS 72 04/07/2011 1402   AST 22 04/16/2012 1410   AST 22 04/07/2011 1402   ALT 16 04/16/2012 1410   ALT 14 04/07/2011 1402   BILITOT 0.61 04/16/2012 1410   BILITOT 0.6 04/07/2011 1402       Lab Results  Component Value Date   LABCA2 18 04/16/2012    No results found for this basename: INR,  in the last 168 hours  No results found for this basename: UACOL, UAPR, USPG, UPH, UTP, UGL, UKET, UBIL, UHGB, UNIT, UROB, ULEU, UEPI, UWBC, URBC, UBAC, CAST, CRYS, UCOM, BILUA,  in the last 72 hours   STUDIES:  DEXA scan 03/24/2011 WNL   DIGITAL BILATERAL SCREENING MAMMOGRAM WITH CAD 10/19/2011 Comparison: Previous exams.  Findings: There are scattered fibroglandular densities. No  suspicious masses, architectural distortion, or calcifications are  present. Lumpectomy changes are present on the left.  Images were processed with CAD.  IMPRESSION:  No mammographic evidence of malignancy.  A result letter of this screening mammogram will be mailed directly  to the patient.  RECOMMENDATION:  Screening mammogram in one year. (Code:SM-B-01Y)   Dg Femur Right  05/14/2012  *RADIOLOGY REPORT*  Clinical Data: History of breast cancer.  Right femur pain.  RIGHT FEMUR - 2 VIEW  Comparison: None.  Findings: There are no  destructive lesions of the right femur.  No fracture.  Soft tissues appear within normal limits.  Visualized portions of the right hip appear normal.  The right obturator ring appears normal.  IMPRESSION: Normal appearance of the right femur and thigh.   Original Report Authenticated By: Andreas Newport, M.D.      ASSESSMENT:  57 y.o.  [place] woman status post left lumpectomy and sentinel lymph node biopsy December of 2005 for a T1c N1 Grade 2 invasive ductal carcinoma, estrogen and progesterone receptor positive, HER2 negative, treated adjuvantly with doxorubicin and cyclophosphamide x 4 followed by paclitaxel x 6 completed in May of 2006 followed by radiation completed in August of 2006, followed by 5 years of tamoxifen completed in August of 2011.   PLAN: She was supposed to have resumed tamoxifen a year ago, but she never filled that prescription. She feels she is pretty much "done with cancer", and was very glad when I offered for her to "graduate" today. She understands she will need yearly mammography and yearly physician breast exam, but not the CA 27-29 or other special labs in the future. Of course we will be always glad to see her back here if the need arises, but no further appointments have been made as of now.    Aamiyah Derrick C    05/14/2012

## 2012-05-15 ENCOUNTER — Telehealth: Payer: Self-pay | Admitting: Oncology

## 2012-05-15 NOTE — Telephone Encounter (Signed)
Letter mail out to Dr.Kimberlee,Shaw office from Dr.Magrinat

## 2012-10-12 ENCOUNTER — Other Ambulatory Visit: Payer: Self-pay

## 2012-10-12 DIAGNOSIS — Z1231 Encounter for screening mammogram for malignant neoplasm of breast: Secondary | ICD-10-CM

## 2012-10-31 ENCOUNTER — Ambulatory Visit
Admission: RE | Admit: 2012-10-31 | Discharge: 2012-10-31 | Disposition: A | Discharge status: Home / Self Care | Payer: 59 | Source: Ambulatory Visit

## 2012-10-31 DIAGNOSIS — Z1231 Encounter for screening mammogram for malignant neoplasm of breast: Secondary | ICD-10-CM

## 2013-01-28 ENCOUNTER — Other Ambulatory Visit: Payer: Self-pay | Admitting: Family Medicine

## 2013-01-28 ENCOUNTER — Ambulatory Visit
Admission: RE | Admit: 2013-01-28 | Discharge: 2013-01-28 | Disposition: A | Discharge status: Home / Self Care | Payer: 59 | Source: Ambulatory Visit | Attending: Family Medicine | Admitting: Family Medicine

## 2013-01-28 DIAGNOSIS — M549 Dorsalgia, unspecified: Secondary | ICD-10-CM

## 2013-10-09 ENCOUNTER — Other Ambulatory Visit: Payer: Self-pay | Admitting: Family Medicine

## 2013-10-09 DIAGNOSIS — R1011 Right upper quadrant pain: Secondary | ICD-10-CM

## 2013-10-11 ENCOUNTER — Ambulatory Visit
Admission: RE | Admit: 2013-10-11 | Discharge: 2013-10-11 | Disposition: A | Discharge status: Home / Self Care | Payer: 59 | Source: Ambulatory Visit | Attending: Family Medicine | Admitting: Family Medicine

## 2013-10-11 DIAGNOSIS — R1011 Right upper quadrant pain: Secondary | ICD-10-CM

## 2013-10-14 ENCOUNTER — Other Ambulatory Visit: Payer: Self-pay

## 2013-10-14 DIAGNOSIS — Z1231 Encounter for screening mammogram for malignant neoplasm of breast: Secondary | ICD-10-CM

## 2013-11-05 ENCOUNTER — Encounter (INDEPENDENT_AMBULATORY_CARE_PROVIDER_SITE_OTHER): Payer: Self-pay

## 2013-11-05 ENCOUNTER — Ambulatory Visit
Admission: RE | Admit: 2013-11-05 | Discharge: 2013-11-05 | Disposition: A | Discharge status: Home / Self Care | Payer: 59 | Source: Ambulatory Visit

## 2013-11-05 DIAGNOSIS — Z1231 Encounter for screening mammogram for malignant neoplasm of breast: Secondary | ICD-10-CM

## 2013-12-11 ENCOUNTER — Ambulatory Visit
Admission: RE | Admit: 2013-12-11 | Discharge: 2013-12-11 | Disposition: A | Discharge status: Home / Self Care | Payer: 59 | Source: Ambulatory Visit | Attending: Family Medicine | Admitting: Family Medicine

## 2013-12-11 ENCOUNTER — Other Ambulatory Visit: Payer: Self-pay | Admitting: Family Medicine

## 2013-12-11 DIAGNOSIS — M5442 Lumbago with sciatica, left side: Principal | ICD-10-CM

## 2013-12-11 DIAGNOSIS — M5441 Lumbago with sciatica, right side: Secondary | ICD-10-CM

## 2014-04-01 ENCOUNTER — Other Ambulatory Visit: Payer: Self-pay | Admitting: Family Medicine

## 2014-04-01 DIAGNOSIS — R229 Localized swelling, mass and lump, unspecified: Principal | ICD-10-CM

## 2014-04-01 DIAGNOSIS — IMO0002 Reserved for concepts with insufficient information to code with codable children: Secondary | ICD-10-CM

## 2014-04-03 ENCOUNTER — Ambulatory Visit
Admission: RE | Admit: 2014-04-03 | Discharge: 2014-04-03 | Disposition: A | Discharge status: Home / Self Care | Payer: 59 | Source: Ambulatory Visit | Attending: Family Medicine | Admitting: Family Medicine

## 2014-04-03 DIAGNOSIS — R229 Localized swelling, mass and lump, unspecified: Principal | ICD-10-CM

## 2014-04-03 DIAGNOSIS — IMO0002 Reserved for concepts with insufficient information to code with codable children: Secondary | ICD-10-CM

## 2014-04-07 ENCOUNTER — Other Ambulatory Visit: Payer: Self-pay | Admitting: Family Medicine

## 2014-04-07 DIAGNOSIS — R229 Localized swelling, mass and lump, unspecified: Secondary | ICD-10-CM

## 2014-04-16 ENCOUNTER — Ambulatory Visit
Admission: RE | Admit: 2014-04-16 | Discharge: 2014-04-16 | Disposition: A | Discharge status: Home / Self Care | Payer: 59 | Source: Ambulatory Visit | Attending: Family Medicine | Admitting: Family Medicine

## 2014-04-16 DIAGNOSIS — R229 Localized swelling, mass and lump, unspecified: Secondary | ICD-10-CM

## 2014-07-03 ENCOUNTER — Other Ambulatory Visit: Payer: Self-pay | Admitting: Family Medicine

## 2014-07-03 DIAGNOSIS — R911 Solitary pulmonary nodule: Secondary | ICD-10-CM

## 2014-07-07 ENCOUNTER — Ambulatory Visit
Admission: RE | Admit: 2014-07-07 | Discharge: 2014-07-07 | Disposition: A | Discharge status: Home / Self Care | Payer: 59 | Source: Ambulatory Visit | Attending: Family Medicine | Admitting: Family Medicine

## 2014-07-07 DIAGNOSIS — R911 Solitary pulmonary nodule: Secondary | ICD-10-CM

## 2014-10-28 ENCOUNTER — Other Ambulatory Visit: Payer: Self-pay

## 2014-10-28 DIAGNOSIS — Z1231 Encounter for screening mammogram for malignant neoplasm of breast: Secondary | ICD-10-CM

## 2014-12-03 ENCOUNTER — Ambulatory Visit
Admission: RE | Admit: 2014-12-03 | Discharge: 2014-12-03 | Disposition: A | Discharge status: Home / Self Care | Payer: 59 | Source: Ambulatory Visit

## 2014-12-03 DIAGNOSIS — Z1231 Encounter for screening mammogram for malignant neoplasm of breast: Secondary | ICD-10-CM

## 2014-12-05 ENCOUNTER — Other Ambulatory Visit: Payer: Self-pay | Admitting: Family Medicine

## 2014-12-05 DIAGNOSIS — R928 Other abnormal and inconclusive findings on diagnostic imaging of breast: Secondary | ICD-10-CM

## 2014-12-15 ENCOUNTER — Other Ambulatory Visit: Payer: Self-pay | Admitting: Family Medicine

## 2014-12-15 ENCOUNTER — Ambulatory Visit
Admission: RE | Admit: 2014-12-15 | Discharge: 2014-12-15 | Disposition: A | Discharge status: Home / Self Care | Payer: 59 | Source: Ambulatory Visit | Attending: Family Medicine | Admitting: Family Medicine

## 2014-12-15 DIAGNOSIS — R921 Mammographic calcification found on diagnostic imaging of breast: Secondary | ICD-10-CM

## 2014-12-15 DIAGNOSIS — R928 Other abnormal and inconclusive findings on diagnostic imaging of breast: Secondary | ICD-10-CM

## 2014-12-16 ENCOUNTER — Ambulatory Visit
Admission: RE | Admit: 2014-12-16 | Discharge: 2014-12-16 | Disposition: A | Discharge status: Home / Self Care | Payer: 59 | Source: Ambulatory Visit | Attending: Family Medicine | Admitting: Family Medicine

## 2014-12-16 DIAGNOSIS — R928 Other abnormal and inconclusive findings on diagnostic imaging of breast: Secondary | ICD-10-CM

## 2014-12-16 DIAGNOSIS — R921 Mammographic calcification found on diagnostic imaging of breast: Secondary | ICD-10-CM

## 2014-12-29 ENCOUNTER — Telehealth: Payer: Self-pay | Admitting: *Deleted

## 2014-12-29 NOTE — Telephone Encounter (Signed)
Received referral from [facility].  Pt is a current pt of Dr. Virgie Dad.  Obtained appt from Dr. Jana Hakim.  Called pt and confirmed 12/31/14 appt w/ her.  Placed a note for an intake form to be given to the pt at time of check in.  Placed the notes in Dr. Virgie Dad box and took one to HIM to scan.  Emailed Shavon at [facility] to make her aware.  Emailed [place], Varney Biles and [place] for tracking.

## 2014-12-30 ENCOUNTER — Other Ambulatory Visit: Payer: Self-pay | Admitting: *Deleted

## 2014-12-30 DIAGNOSIS — Z853 Personal history of malignant neoplasm of breast: Secondary | ICD-10-CM

## 2014-12-30 DIAGNOSIS — C50419 Malignant neoplasm of upper-outer quadrant of unspecified female breast: Secondary | ICD-10-CM | POA: Insufficient documentation

## 2014-12-31 ENCOUNTER — Other Ambulatory Visit (HOSPITAL_BASED_OUTPATIENT_CLINIC_OR_DEPARTMENT_OTHER): Payer: 59

## 2014-12-31 ENCOUNTER — Ambulatory Visit (HOSPITAL_BASED_OUTPATIENT_CLINIC_OR_DEPARTMENT_OTHER): Payer: 59 | Admitting: Oncology

## 2014-12-31 ENCOUNTER — Telehealth: Payer: Self-pay | Admitting: Oncology

## 2014-12-31 VITALS — BP 122/66 | HR 70 | Temp 98.2°F | Resp 18 | Ht 65.0 in | Wt 151.1 lb

## 2014-12-31 DIAGNOSIS — Z170 Estrogen receptor positive status [ER+]: Secondary | ICD-10-CM

## 2014-12-31 DIAGNOSIS — Z853 Personal history of malignant neoplasm of breast: Secondary | ICD-10-CM

## 2014-12-31 DIAGNOSIS — D0511 Intraductal carcinoma in situ of right breast: Secondary | ICD-10-CM

## 2014-12-31 DIAGNOSIS — C50911 Malignant neoplasm of unspecified site of right female breast: Secondary | ICD-10-CM | POA: Insufficient documentation

## 2014-12-31 LAB — CBC WITH DIFFERENTIAL/PLATELET
BASO%: 2.5 % — AB (ref 0.0–2.0)
Basophils Absolute: 0.2 10*3/uL — ABNORMAL HIGH (ref 0.0–0.1)
EOS%: 6.4 % (ref 0.0–7.0)
Eosinophils Absolute: 0.4 10*3/uL (ref 0.0–0.5)
HEMATOCRIT: 45.3 % (ref 34.8–46.6)
HGB: 15.1 g/dL (ref 11.6–15.9)
LYMPH#: 1.6 10*3/uL (ref 0.9–3.3)
LYMPH%: 24 % (ref 14.0–49.7)
MCH: 33.1 pg (ref 25.1–34.0)
MCHC: 33.3 g/dL (ref 31.5–36.0)
MCV: 99.3 fL (ref 79.5–101.0)
MONO#: 0.4 10*3/uL (ref 0.1–0.9)
MONO%: 6.1 % (ref 0.0–14.0)
NEUT%: 61 % (ref 38.4–76.8)
NEUTROS ABS: 4.1 10*3/uL (ref 1.5–6.5)
PLATELETS: 150 10*3/uL (ref 145–400)
RBC: 4.56 10*6/uL (ref 3.70–5.45)
RDW: 13.2 % (ref 11.2–14.5)
WBC: 6.7 10*3/uL (ref 3.9–10.3)

## 2014-12-31 LAB — COMPREHENSIVE METABOLIC PANEL (CC13)
ALT: 16 U/L (ref 0–55)
ANION GAP: 8 meq/L (ref 3–11)
AST: 23 U/L (ref 5–34)
Albumin: 4.5 g/dL (ref 3.5–5.0)
Alkaline Phosphatase: 72 U/L (ref 40–150)
BILIRUBIN TOTAL: 0.61 mg/dL (ref 0.20–1.20)
BUN: 15.1 mg/dL (ref 7.0–26.0)
CHLORIDE: 102 meq/L (ref 98–109)
CO2: 29 meq/L (ref 22–29)
CREATININE: 0.9 mg/dL (ref 0.6–1.1)
Calcium: 10.2 mg/dL (ref 8.4–10.4)
EGFR: 66 mL/min/{1.73_m2} — ABNORMAL LOW (ref >90)
Glucose: 84 mg/dl (ref 70–140)
Potassium: 4.4 mEq/L (ref 3.5–5.1)
Sodium: 139 mEq/L (ref 136–145)
TOTAL PROTEIN: 7.5 g/dL (ref 6.4–8.3)

## 2014-12-31 NOTE — Telephone Encounter (Signed)
Appointments made and avs printed for pateint °

## 2014-12-31 NOTE — Progress Notes (Signed)
Kristin Adams  [phone]) [phone] Fax:([phone]     ID: Kristin Adams DOB: Jul 06, 1955  MR#: [id number]  KGU#:[id number]  Patient Care Team: Mayra Neer, MD as PCP - General (Family Medicine) Chauncey Cruel, MD (Hematology and Oncology) Gery Pray, MD (Radiation Oncology) Autumn Messing III, MD as Consulting Physician (General Surgery) Wallace Going, DO as Attending Physician (Plastic Surgery) PCP: Mayra Neer, MD OTHER MD:  CHIEF COMPLAINT: Ductal carcinoma in situ  CURRENT TREATMENT: Awaiting definitive surgery   BREAST CANCER HISTORY: From the 03/17/2004 intake note:  The patient had a screening mammogram at [facility] on 01-22-04.  The prior mammogram had been in August 2002.  This new screening mammogram showed a possible left breast mass, and the patient was brought back for additional studies on 02-10-04.  Spot compression views confirmed the presence of a spiculated mass in the 11:00 position of the left breast which could be palpated once it had been located by mammography.  Ultrasound showed a spiculated solid mass measuring up to 1.3 cm. The axilla on the left was unremarkable.  Accordingly, the patient proceeded to biopsy under ultrasound guidance on the same day, 02-10-04, and this showed ([id number]) an invasive mammary carcinoma which was ER positive at 90%, PR positive at 94%, and HER-2/neu negative by FISH.  With this information, the patient was referred to Dr. Annamaria Boots and, after appropriate discussion, she underwent left lumpectomy with sentinel lymph node biopsy on 02-26-04.  The final pathology there ([id number]) showed one of the two sentinel lymph nodes to be positive for metastatic disease (greater than 2 mm.).  The mass itself measured 1.6 cm., it was Grade 2, margins were adequate, the patient accordingly staging as T1, N1.  With this information, she is referred here for further evaluation and treatment.    Her subsequent  history as far as her left breast cancer is concerned is as detailed below.  More recently however Kristin Adams had bilateral screening [facility]. This showed a suspicious area of calcification in the right breast which was further evaluated with right diagnostic mammography on 12/15/2014. The breast density was category B. In the upper outer quadrant of the right breast there were amorphus calcifications spanning approximately 6 cm in a segmental distribution. Biopsy of this area 12/16/2014 showed ([id number]) ductal carcinoma in situ, low-grade, estrogen receptor 90% positive, progesterone receptor 20% positive. A second area also showed DCIS and it was again estrogen receptor 100% positive and progesterone receptor 10% positive  Her subsequent history as detailed below  INTERVAL HISTORY: Kristin Adams was evaluated in the breast clinic 12/31/2014 accompanied by her husband Kristin Adams. Her case was also presented in the multidisciplinary breast cancer conference 12/24/2014. At that point the consensus was that the patient would benefit from mastectomy. Genetics counseling was also recommended.  REVIEW OF SYSTEMS: There were no specific symptoms leading to the original mammogram, which was routinely scheduled. The patient denies unusual headaches, visual changes, nausea, vomiting, stiff neck, dizziness, or gait imbalance. There has been no cough, phlegm production, or pleurisy, no chest pain or pressure, and no change in bowel or bladder habits. The patient denies fever, rash, bleeding, unexplained fatigue or unexplained weight loss. She still has some discomfort at the site of recent biopsy. She also admits to chronic insomnia. A detailed review of systems was otherwise entirely negative.  PAST MEDICAL HISTORY: Past Medical History  Diagnosis Date  . IBS (irritable bowel syndrome)   . Headaches,  cluster   . Wears glasses   . Cancer     Breast Cancer  . Arthritis     . Mitral valve prolapse     PAST SURGICAL HISTORY: Past Surgical History  Procedure Laterality Date  . Vaginal hysterectomy  2000  . Breast lumpectomy  2005    FAMILY HISTORY Family History  Problem Relation Age of Onset  . Heart disease Father   . Cancer Mother     colon  . Diabetes Brother   . Heart disease Brother   . Diabetes Sister   . Kidney disease Sister   The patient's father died at the age of 67 from congestive heart failure.  The patient's mother died at age 44. She had colon cancer..  The patient has two brothers and two sisters. One sister was diagnosed with breast cancer at age 3.  There is no other history of ovarian or breast cancer in the family.    GYNECOLOGIC HISTORY:  No LMP recorded. Patient has had a hysterectomy. Menarche age 51, first live birth age 43, which increases the risk of breast cancer. She is G1, P1. She underwent hysterectomy with bilateral salpingo-oophorectomy in 2000. She used hormone replacement approximately 10 years  SOCIAL HISTORY:  She works in Animal nutritionist for [employer].  Her husband, Kristin Adams, who is present today, worked as a Dealer with the [employer] but is now retired.  Their daughter, Kristin Adams lives in [place] and is a stay-at-home mom.  ADVANCED DIRECTIVES: In place   HEALTH MAINTENANCE: Social History  Substance Use Topics  . Smoking status: Current Every Day Smoker -- 0.50 packs/day for 38 years    Types: Cigarettes  . Smokeless tobacco: Not on file  . Alcohol Use: 1.5 oz/week    3 drink(s) per week     Colonoscopy: 2013/[facility]  PAP: Status post hysterectomy  Bone density: 2015/osteopenia  Lipid panel:  Allergies  Allergen Reactions  . Corticosteroids     Current Outpatient Prescriptions  Medication Sig Dispense Refill  . ALPRAZolam (XANAX) 0.5 MG tablet Take 0.5 mg by mouth at bedtime as needed.    . B Complex-C (SUPER B COMPLEX PO) Take by mouth daily.      . Calcium  Carbonate-Vitamin D (CALCIUM + D PO) Take 1,200 mg by mouth daily.      . Cholecalciferol (VITAMIN D) 1000 UNITS capsule Take 1,000 Units by mouth daily.      . diphenhydrAMINE (BENADRYL) 25 mg capsule Take 1 capsule (25 mg total) by mouth at bedtime as needed. 30 capsule 0  . escitalopram (LEXAPRO) 20 MG tablet Take 20 mg by mouth daily.      . metoprolol (TOPROL-XL) 50 MG 24 hr tablet Take 25 mg by mouth daily.    . Multiple Vitamin (MULTIVITAMIN PO) Take by mouth daily.      . Omega-3 Fatty Acids (FISH OIL) 1000 MG CAPS Take by mouth daily.      . vitamin C (ASCORBIC ACID) 500 MG tablet Take 500 mg by mouth daily.      . Vitamin E (VITA-PLUS E PO) Take by mouth as needed.      . Zinc Sulfate (ZINC 15 PO) Take by mouth as needed.       No current facility-administered medications for this visit.    OBJECTIVE: Middle-aged white woman who appears stated age 26 Vitals:   12/31/14 1056  BP: 122/66  Pulse: 70  Temp: 98.2 F (36.8 C)  Resp: 18     Body mass index is 25.14 kg/(m^2).    ECOG FS:0 - Asymptomatic  Ocular: Sclerae unicteric, pupils equal, round and reactive to light Ear-nose-throat: Oropharynx clear and moist Lymphatic: No cervical or supraclavicular adenopathy Lungs no rales or rhonchi, good excursion bilaterally Heart regular rate and rhythm, no murmur appreciated Abd soft, nontender, positive bowel sounds MSK no focal spinal tenderness, no joint edema Neuro: non-focal, well-oriented, appropriate affect Breasts: The right breast is status post recent biopsy. There is a moderate ecchymosis. I do not palpate a mass. The right axilla is benign. The left breast is status post remote lumpectomy and radiation. There is no evidence of local recurrence. The left axilla is benign.   LAB RESULTS:  CMP     Component Value Date/Time   NA 139 12/31/2014 1030   NA 139 04/07/2011 1402   K 4.4 12/31/2014 1030   K 3.9 04/07/2011 1402   CL 106 04/16/2012 1410   CL 102  04/07/2011 1402   CO2 29 12/31/2014 1030   CO2 26 04/07/2011 1402   GLUCOSE 84 12/31/2014 1030   GLUCOSE 80 04/16/2012 1410   GLUCOSE 113* 04/07/2011 1402   BUN 15.1 12/31/2014 1030   BUN 10 04/07/2011 1402   CREATININE 0.9 12/31/2014 1030   CREATININE 0.87 04/07/2011 1402   CALCIUM 10.2 12/31/2014 1030   CALCIUM 9.7 04/07/2011 1402   PROT 7.5 12/31/2014 1030   PROT 6.8 04/07/2011 1402   ALBUMIN 4.5 12/31/2014 1030   ALBUMIN 4.7 04/07/2011 1402   AST 23 12/31/2014 1030   AST 22 04/07/2011 1402   ALT 16 12/31/2014 1030   ALT 14 04/07/2011 1402   ALKPHOS 72 12/31/2014 1030   ALKPHOS 72 04/07/2011 1402   BILITOT 0.61 12/31/2014 1030   BILITOT 0.6 04/07/2011 1402   GFRNONAA >60 05/15/2009 1322   GFRAA  05/15/2009 1322    >60        The eGFR has been calculated using the MDRD equation. This calculation has not been validated in all clinical situations. eGFR's persistently <60 mL/min signify possible Chronic Kidney Disease.    INo results found for: SPEP, UPEP  Lab Results  Component Value Date   WBC 6.7 12/31/2014   NEUTROABS 4.1 12/31/2014   HGB 15.1 12/31/2014   HCT 45.3 12/31/2014   MCV 99.3 12/31/2014   PLT 150 12/31/2014      Chemistry      Component Value Date/Time   NA 139 12/31/2014 1030   NA 139 04/07/2011 1402   K 4.4 12/31/2014 1030   K 3.9 04/07/2011 1402   CL 106 04/16/2012 1410   CL 102 04/07/2011 1402   CO2 29 12/31/2014 1030   CO2 26 04/07/2011 1402   BUN 15.1 12/31/2014 1030   BUN 10 04/07/2011 1402   CREATININE 0.9 12/31/2014 1030   CREATININE 0.87 04/07/2011 1402      Component Value Date/Time   CALCIUM 10.2 12/31/2014 1030   CALCIUM 9.7 04/07/2011 1402   ALKPHOS 72 12/31/2014 1030   ALKPHOS 72 04/07/2011 1402   AST 23 12/31/2014 1030   AST 22 04/07/2011 1402   ALT 16 12/31/2014 1030   ALT 14 04/07/2011 1402   BILITOT 0.61 12/31/2014 1030   BILITOT 0.6 04/07/2011 1402       Lab Results  Component Value Date   LABCA2 18  04/16/2012    No components found for: [id number]  No results for input(s): INR in the last 168 hours.  Urinalysis    Component Value Date/Time   LABSPEC 1.005 04/14/2010 1037   PHURINE 5.0 04/14/2010 1037   HGBUR Negative 04/14/2010 1037   BILIRUBINUR Negative 04/14/2010 1037   KETONESUR Negative 04/14/2010 1037   PROTEINUR Negative 04/14/2010 1037   NITRITE Negative 04/14/2010 1037   LEUKOCYTESUR Moderate 04/14/2010 1037    STUDIES: Mm Digital Diagnostic Unilat R  12/16/2014  CLINICAL DATA:  Post biopsy mammogram of the right breast for clip placement. EXAM: DIAGNOSTIC RIGHT MAMMOGRAM POST STEREOTACTIC BIOPSY COMPARISON:  Previous exam(s). FINDINGS: Mammographic images were obtained following stereotactic guided biopsy of calcifications in the upper-outer quadrant of the right breast. The X shaped biopsy marking clip is appropriately positioned at the intended site of biopsy in the upper outer quadrant. IMPRESSION: Appropriate positioning of the X shaped biopsy marking clip in the upper-outer quadrant of the right breast at the intended site of biopsy. Final Assessment: Post Procedure Mammograms for Marker Placement Electronically Signed   By: Ammie Ferrier M.D.   On: 12/16/2014 15:18   Mm Digital Diagnostic Unilat R  12/15/2014  CLINICAL DATA:  59 year old female presenting for screening recall of calcifications in the right breast. EXAM: DIGITAL DIAGNOSTIC RIGHT MAMMOGRAM WITH CAD COMPARISON:  Previous exam(s). ACR Breast Density Category b: There are scattered areas of fibroglandular density. FINDINGS: In the upper-outer quadrant of the right breast, there are loosely grouped punctate and amorphous calcifications in the anterior depth which span approximately 6 cm. These are in a segmental distribution and extend toward the nipple. No other suspicious calcifications, masses or areas of distortion are seen on these diagnostic images. Mammographic images were processed with CAD.  IMPRESSION: Punctate and amorphous calcifications in the upper-outer quadrant of the right breast in a suspicious segmental distribution which spans approximately 6 cm. RECOMMENDATION: Stereotactic biopsy is recommended for the right breast calcifications. This has been scheduled for 12/16/2014. I have discussed the findings and recommendations with the patient. Results were also provided in writing at the conclusion of the visit. If applicable, a reminder letter will be sent to the patient regarding the next appointment. BI-RADS CATEGORY  4: Suspicious abnormality - biopsy should be considered. Electronically Signed   By: Ammie Ferrier M.D.   On: 12/15/2014 09:00   Mm Screening Breast Tomo Bilateral  12/04/2014  CLINICAL DATA:  Screening. History of left breast cancer in 2006 status post breast conservation therapy. EXAM: DIGITAL SCREENING BILATERAL MAMMOGRAM WITH 3D TOMO WITH CAD COMPARISON:  Previous exam(s). ACR Breast Density Category c: The breast tissue is heterogeneously dense, which may obscure small masses. FINDINGS: In the right breast, calcifications warrant further evaluation with magnified views. These calcifications are localized to the upper-outer quadrant of the right breast and their extent is best seen on tomosynthesis. In the left breast, no findings suspicious for malignancy. There are stable postsurgical changes within the upper left breast. Images were processed with CAD. IMPRESSION: Further evaluation is suggested for calcifications in the right breast. RECOMMENDATION: Diagnostic mammogram of the right breast. (Code:[id number]) The patient will be contacted regarding the findings, and additional imaging will be scheduled. BI-RADS CATEGORY  0: Incomplete. Need additional imaging evaluation and/or prior mammograms for comparison. Electronically Signed   By: Franki Cabot M.D.   On: 12/04/2014 16:59   Mm Rt Breast Bx W Loc Dev 1st Lesion Image Bx Spec Stereo Guide  12/18/2014  ADDENDUM  REPORT: 12/18/2014 14:50 ADDENDUM: Pathology reveals low grade ductal carcinoma in situ with calcification of the Right breast. This was found to  be concordant by Dr. Pamelia Hoit. Pathology results were discussed with the patient via telephone. The patient reported no problems with the biopsy site and is doing well. Post biopsy care and instructions were reviewed and questions were answered. The patient was encouraged to call [facility] with any additional questions and or concerns. The patient has a prior history of Left breast cancer. A surgical referral was arranged with Dr. Autumn Messing of [facility] on December 22, 2014. Pathology results reported by Constance Holster on December 18, 2014. Electronically Signed   By: Ammie Ferrier M.D.   On: 12/18/2014 14:50  12/18/2014  CLINICAL DATA:  59 year old female presenting for stereotactic biopsy of calcifications in the right breast. EXAM: RIGHT BREAST STEREOTACTIC CORE NEEDLE BIOPSY COMPARISON:  Previous exams. FINDINGS: The patient and I discussed the procedure of stereotactic-guided biopsy including benefits and alternatives. We discussed the high likelihood of a successful procedure. We discussed the risks of the procedure including infection, bleeding, tissue injury, clip migration, and inadequate sampling. Informed written consent was given. The usual time out protocol was performed immediately prior to the procedure. Using sterile technique and 1% lidocaine as local anesthetic, under stereotactic guidance, a 9 gauge vacuum assisted device was used to perform core needle biopsy of calcifications in the upper-outer quadrant of the right breast using a lateral approach. Specimen radiograph was performed showing calcifications within several of the core samples. Specimens with calcifications are identified for pathology. At the conclusion of the procedure, a X shaped tissue marker clip was deployed into the biopsy cavity.  Follow-up 2-view mammogram was performed and dictated separately. IMPRESSION: Stereotactic-guided biopsy of calcifications in the upper-outer quadrant of the right breast. No apparent complications. Electronically Signed: By: Ammie Ferrier M.D. On: 12/16/2014 15:23    ASSESSMENT: 59 y.o. [place] woman  (1) status post left lumpectomy and sentinel lymph node biopsy December of 2005 for a T1c N1, stage IIA invasive ductal carcinoma, grade 2, estrogen and progesterone receptor positive, HER2 negative,   (a) treated adjuvantly with doxorubicin /cyclophosphamide x 4 followed by paclitaxel x 6 completed May of 2006   (b) followed by radiation completed in August of 2006,  (c) followed by 5 years of tamoxifen completed in August of 2011.    (2) status post right breast upper outer quadrant biopsy 12/16/2014 for ductal carcinoma in situ, low-grade, estrogen and progesterone receptor positive, measuring approximately 6 cm  (3) genetics testing pending  PLAN: We spent the better part of today's hour-long appointment discussing the fact that Kristin Adams's breast cancer is different from the prior one, and the specifics of her current tumor. Kristin Adams understands that in noninvasive ductal carcinoma, also called ductal carcinoma in situ ("DCIS") the breast cancer cells remain trapped in the ducts were they started. They cannot travel to a vital organ. For that reason these cancers in themselves are not life-threatening.  If the whole breast is removed then all the ducts are removed and since the cancer cells are trapped in the ducts, the cure rate with mastectomy for noninvasive breast cancer is approximately 99%. Nevertheless we generally recommend lumpectomy, because there is no survival advantage to mastectomy and because the cosmetic result can be superior with breast conservation.  In her case however we are concerned that the amount of tissue that would have to be removed is too large for her to have a  lumpectomy. The possibility of positive margins are also a major concern to her. She would  be interested in one procedure, which would mean mastectomy, and would like to consider immediate reconstruction. She has a ready discussed this with Dr. Marlou Starks and Dr. Migdalia Dk and they are trying to come up with a surgical date.  We also believe she warrants genetic testing. If this proves to be positive she understands she does not have to have both breasts removed, because we have the option of intensified screening, with yearly MRI as well as yearly mammography. At this point she feels she would rather proceed with surgery to the right breast as planned and keep the left breast but do intensified screening is just described if she carries a deleterious mutation.  The role of antiestrogen skin this setting is optional. We will discuss that when she returns to see me after completion of her local treatment. Kristin Adams has a good understanding of the overall plan. She agrees with it. She knows the goal of treatment in her case is cure. She will call with any problems that may develop before her next visit here.  Chauncey Cruel, MD   12/31/2014 6:32 PM Medical Oncology and Hematology [facility] [address] [place], [place] [address] Tel. [phone]    Fax. [phone]

## 2015-01-12 ENCOUNTER — Ambulatory Visit (HOSPITAL_BASED_OUTPATIENT_CLINIC_OR_DEPARTMENT_OTHER): Payer: 59 | Admitting: Genetic Counselor

## 2015-01-12 ENCOUNTER — Other Ambulatory Visit: Payer: 59

## 2015-01-12 ENCOUNTER — Encounter: Payer: Self-pay | Admitting: Genetic Counselor

## 2015-01-12 DIAGNOSIS — Z853 Personal history of malignant neoplasm of breast: Secondary | ICD-10-CM | POA: Diagnosis not present

## 2015-01-12 DIAGNOSIS — Z315 Encounter for genetic counseling: Secondary | ICD-10-CM

## 2015-01-12 DIAGNOSIS — C50911 Malignant neoplasm of unspecified site of right female breast: Secondary | ICD-10-CM

## 2015-01-12 DIAGNOSIS — D0511 Intraductal carcinoma in situ of right breast: Secondary | ICD-10-CM

## 2015-01-12 DIAGNOSIS — Z803 Family history of malignant neoplasm of breast: Secondary | ICD-10-CM

## 2015-01-12 DIAGNOSIS — Z800 Family history of malignant neoplasm of digestive organs: Secondary | ICD-10-CM | POA: Diagnosis not present

## 2015-01-12 NOTE — Progress Notes (Signed)
REFERRING PROVIDER: Mayra Neer, MD [address], [place] [address]  Lurline Del, MD  PRIMARY PROVIDER:  Mayra Neer, MD  PRIMARY REASON FOR VISIT:  1. Malignant neoplasm of right female breast, unspecified site of breast ([place])   2. Family history of breast cancer   3. Family history of colon cancer   4. Personal history of malignant neoplasm of breast      HISTORY OF PRESENT ILLNESS:   Kristin Adams, a 59 y.o. female, was seen for a [facility] cancer genetics consultation at the request of Dr. Jana Hakim due to a personal and family history of cancer.  Kristin Adams presents to clinic today to discuss the possibility of a hereditary predisposition to cancer, genetic testing, and to further clarify her future cancer risks, as well as potential cancer risks for family members.   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 This was treated with lumpectomy, chemotherapy, radiation and tamoxifen.  In 2016, at the age of 41, Kristin Adams was diagnosed with DCIS breast cancer in her right breast. She thinks that she will have a double mastectomy.   CANCER HISTORY:   No history exists.     HORMONAL RISK FACTORS:  Menarche was at age 50.  First live birth at age 66.  OCP use for approximately 10+ years.  Ovaries intact: no.  Hysterectomy: yes.  Menopausal status: postmenopausal.  HRT use: 5 years. Colonoscopy: yes; normal. Mammogram within the last year: yes. Number of breast biopsies: 3. Up to date with pelvic exams:  yes. Any excessive radiation exposure in the past:  Radiation treatment  Past Medical History  Diagnosis Date  . IBS (irritable bowel syndrome)   . Headaches, cluster   . Wears glasses   . Cancer [facility])     Breast Cancer  . Arthritis   . Mitral valve prolapse     Past Surgical History  Procedure Laterality Date  . Vaginal hysterectomy  2000  . Breast lumpectomy  2005    Social  History   Social History  . Marital Status: Married    Spouse Name: N/A  . Number of Children: 1  . Years of Education: N/A   Social History Main Topics  . Smoking status: Current Every Day Smoker -- 0.50 packs/day for 42 years    Types: Cigarettes  . Smokeless tobacco: None  . Alcohol Use: 1.5 oz/week    3 Standard drinks or equivalent per week  . Drug Use: No  . Sexual Activity: Not Asked   Other Topics Concern  . None   Social History Narrative     FAMILY HISTORY:  We obtained a detailed, 4-generation family history.  Significant diagnoses are listed below: Family History  Problem Relation Age of Onset  . Heart disease Father   . Cancer Mother 14    colon  . Diabetes Brother   . Heart disease Brother   . Diabetes Sister   . Breast cancer Sister 39  . Kidney disease Sister   . Melanoma Maternal Aunt   . Lung cancer Maternal Aunt    The patient has one daughter, two sisters and two brothers.  One sister was diagnosed breast cancer at 22.  Her mother was diagnosed with colon cancer at 78.  She was one of 12 children.  One sister was diagnosed with melanoma, and another was diagnosed with lung cancer.  There is no other reported cancer  history.  Patient's ancestors are of [place] descent. There is no reported Ashkenazi Jewish ancestry. There is no known consanguinity.  GENETIC COUNSELING ASSESSMENT: Kristin Adams is a 59 y.o. female with a personal history of bilateral breast cancer which somewhat suggestive of a hereditary cancer syndrome and predisposition to cancer. We, therefore, discussed and recommended the following at today's visit.   DISCUSSION: We discussed that about 5-10% of breast cancer is hereditary.  Of this the majority is due to BRCA mutations.  We also reviewed other hereditary cancer syndrome genes that can be seen more commonly including CHEK2, ATM and PALB2.  We reviewed the characteristics, features and inheritance patterns of hereditary cancer  syndromes. We also discussed genetic testing, including the appropriate family members to test, the process of testing, insurance coverage and turn-around-time for results. We discussed the implications of a negative, positive and/or variant of uncertain significant result. We recommended Kristin Adams pursue genetic testing for the Breast/[place] cancer syndrome gene panel. The Breast/Ovarian gene panel offered by [facility] includes sequencing and rearrangement analysis for the following 20 genes:  ATM, BARD1, BRCA1, BRCA2, BRIP1, CDH1, CHEK2, EPCAM, FANCC, MLH1, MSH2, MSH6, NBN, PALB2, PMS2, PTEN, RAD51C, RAD51D, TP53, and XRCC2.     Based on Ms. Gest personal and family history of cancer, she meets medical criteria for genetic testing. Despite that she meets criteria, she may still have an out of pocket cost. We discussed that if her out of pocket cost for testing is over $100, the laboratory will call and confirm whether she wants to proceed with testing.  If the out of pocket cost of testing is less than $100 she will be billed by the genetic testing laboratory.   PLAN: After considering the risks, benefits, and limitations, Kristin Adams  provided informed consent to pursue genetic testing and the blood sample was sent to [facility] for analysis of the Breast/[place] cancer syndrome panel. Results should be available within approximately 2-3 weeks' time, at which point they will be disclosed by telephone to Kristin Adams, as will any additional recommendations warranted by these results. Kristin Adams will receive a summary of her genetic counseling visit and a copy of her results once available. This information will also be available in Epic. We encouraged Kristin Adams to remain in contact with cancer genetics annually so that we can continuously update the family history and inform her of any changes in cancer genetics and testing that may be of benefit for her family. Kristin Adams questions were answered to her  satisfaction today. Our contact information was provided should additional questions or concerns arise.  Lastly, we encouraged Kristin Adams to remain in contact with cancer genetics annually so that we can continuously update the family history and inform her of any changes in cancer genetics and testing that may be of benefit for this family.   Ms.  Adams questions were answered to her satisfaction today. Our contact information was provided should additional questions or concerns arise. Thank you for the referral and allowing [place] to share in the care of your patient.   Karen P. Florene Glen, [place], [facility] Certified Genetic Counselor [email or web address] phone: [phone]  The patient was seen for a total of 30 minutes in face-to-face genetic counseling.  This patient was discussed with Drs. Magrinat, Lindi Adie and/or Burr Medico who agrees with the above.    _______________________________________________________________________ For Office Staff:  Number of people involved in session: 1 Was an Intern/ student involved with case: no

## 2015-01-20 ENCOUNTER — Other Ambulatory Visit: Payer: Self-pay | Admitting: General Surgery

## 2015-01-20 DIAGNOSIS — D0511 Intraductal carcinoma in situ of right breast: Secondary | ICD-10-CM

## 2015-01-23 ENCOUNTER — Encounter: Payer: Self-pay | Admitting: *Deleted

## 2015-01-26 ENCOUNTER — Other Ambulatory Visit: Payer: Self-pay | Admitting: *Deleted

## 2015-01-27 ENCOUNTER — Telehealth: Payer: Self-pay | Admitting: Oncology

## 2015-01-27 NOTE — Telephone Encounter (Signed)
Spoke with patient re new f/u for 03/11/15. Moved from 12/9 to 1/4 per 11/18 pof. No availability 03/12/15 due to full call day.

## 2015-02-05 ENCOUNTER — Encounter: Payer: Self-pay | Admitting: Genetic Counselor

## 2015-02-05 ENCOUNTER — Telehealth: Payer: Self-pay | Admitting: Genetic Counselor

## 2015-02-05 DIAGNOSIS — Z1379 Encounter for other screening for genetic and chromosomal anomalies: Secondary | ICD-10-CM | POA: Insufficient documentation

## 2015-02-05 NOTE — Telephone Encounter (Signed)
Revealed that a VUS was found on her Breast/Ovarian cancer panel, but it was otherwise negative.  Discussed that this test is treated as a negative test, and that most of the time VUS are reclassified as inconsequential.  The lab will keep [place] updated.  She indicated that her daughter is pregnant and is having a reveal party on the gender of the baby next week and she will "reveal" her essentially negative results as well.

## 2015-02-06 ENCOUNTER — Ambulatory Visit: Payer: Self-pay | Admitting: Genetic Counselor

## 2015-02-06 DIAGNOSIS — C50911 Malignant neoplasm of unspecified site of right female breast: Secondary | ICD-10-CM

## 2015-02-06 DIAGNOSIS — Z853 Personal history of malignant neoplasm of breast: Secondary | ICD-10-CM

## 2015-02-06 DIAGNOSIS — Z1379 Encounter for other screening for genetic and chromosomal anomalies: Secondary | ICD-10-CM

## 2015-02-06 NOTE — Progress Notes (Addendum)
HPI: Ms. Anstine was previously seen in the [facility] due to a personal and family history of breast cancer and concerns regarding a hereditary predisposition to cancer. Please refer to our prior cancer genetics clinic note for more information regarding Ms. Rothenberger medical, social and family histories, and our assessment and recommendations, at the time. Ms. Nicoll recent genetic test results were disclosed to her, as were recommendations warranted by these results. These results and recommendations are discussed in more detail below.  FAMILY HISTORY:  We obtained a detailed, 4-generation family history.  Significant diagnoses are listed below: Family History  Problem Relation Age of Onset  . Heart disease Father   . Cancer Mother 31    colon  . Diabetes Brother   . Heart disease Brother   . Diabetes Sister   . Breast cancer Sister 59  . Kidney disease Sister   . Melanoma Maternal Aunt   . Lung cancer Maternal Aunt     The patient has one daughter, two sisters and two brothers. One sister was diagnosed breast cancer at 29. Her mother was diagnosed with colon cancer at 71. She was one of 12 children. One sister was diagnosed with melanoma, and another was diagnosed with lung cancer. There is no other reported cancer history. Patient's ancestors are of [place] descent. There is no reported Ashkenazi Jewish ancestry. There is no known consanguinity.  GENETIC TEST RESULTS: At the time of Ms. Vanalstine visit, we recommended she pursue genetic testing of the Breast/Ovarian cancer gene panel. The Breast/Ovarian gene panel offered by [facility] includes sequencing and rearrangement analysis for the following 20 genes:  ATM, BARD1, BRCA1, BRCA2, BRIP1, CDH1, CHEK2, EPCAM, FANCC, MLH1, MSH2, MSH6, NBN, PALB2, PMS2, PTEN, RAD51C, RAD51D, TP53, and XRCC2.   The report date is February 04, 2015.  Genetic testing was normal, and did not reveal a deleterious mutation in these  genes. The test report has been scanned into EPIC and is located under the Molecular Pathology section of the Results Review tab.   We discussed with Ms. Kukla that since the current genetic testing is not perfect, it is possible there may be a gene mutation in one of these genes that current testing cannot detect, but that chance is small. We also discussed, that it is possible that another gene that has not yet been discovered, or that we have not yet tested, is responsible for the cancer diagnoses in the family, and it is, therefore, important to remain in touch with cancer genetics in the future so that we can continue to offer Ms. Quintin the most up to date genetic testing.   Genetic testing did detect a Variant of Unknown Significance in the BRCA2 gene called c.2803G>C. At this time, it is unknown if this variant is associated with increased cancer risk or if this is a normal finding, but most variants such as this get reclassified to being inconsequential. It should not be used to make medical management decisions. With time, we suspect the lab will determine the significance of this variant, if any. If we do learn more about it, we will try to contact Ms. Bachmann to discuss it further. However, it is important to stay in touch with [place] periodically and keep the address and phone number up to date. BRCA2 c.2803G>C VUS found on the Breast/Ovarian cancer panel. The Breast/Ovarian gene panel offered by [facility] includes sequencing and rearrangement analysis for the following 20 genes:  ATM, BARD1, BRCA1, BRCA2, BRIP1,  CDH1, CHEK2, EPCAM, FANCC, MLH1, MSH2, MSH6, NBN, PALB2, PMS2, PTEN, RAD51C, RAD51D, TP53, and XRCC2.   The report date is February 04, 2015. BRCA2 c.2803G>C VUS has been reclassified to a likely benign variant based on a combination of sources, e.g., internal data, published literature, population databases and in [place].  The reclassification date is March 28, 2017.  CANCER SCREENING  RECOMMENDATIONS: This result is reassuring and indicates that Ms. Salomone likely does not have an increased risk for a future cancer due to a mutation in one of these genes. This normal test also suggests that Ms. Scarpati cancer was most likely not due to an inherited predisposition associated with one of these genes.  Most cancers happen by chance and this negative test suggests that her cancer falls into this category.  We, therefore, recommended she continue to follow the cancer management and screening guidelines provided by her oncology and primary healthcare provider.   RECOMMENDATIONS FOR FAMILY MEMBERS: Women in this family might be at some increased risk of developing cancer, over the general population risk, simply due to the family history of cancer. We recommended women in this family have a yearly mammogram beginning at age 16, or 44 years younger than the earliest onset of cancer, an an annual clinical breast exam, and perform monthly breast self-exams. Women in this family should also have a gynecological exam as recommended by their primary provider. All family members should have a colonoscopy by age 87.  FOLLOW-UP: Lastly, we discussed with Ms. Plaugher that cancer genetics is a rapidly advancing field and it is possible that new genetic tests will be appropriate for her and/or her family members in the future. We encouraged her to remain in contact with cancer genetics on an annual basis so we can update her personal and family histories and let her know of advances in cancer genetics that may benefit this family.   Our contact number was provided. Ms. Colasanti questions were answered to her satisfaction, and she knows she is welcome to call us at anytime with additional questions or concerns.   Roma Kayser, MS, [facility] Certified Genetic Counselor [email or web address]

## 2015-02-11 ENCOUNTER — Encounter (HOSPITAL_COMMUNITY): Payer: Self-pay

## 2015-02-11 ENCOUNTER — Encounter (HOSPITAL_COMMUNITY)
Admission: RE | Admit: 2015-02-11 | Discharge: 2015-02-11 | Disposition: A | Discharge status: Home / Self Care | Payer: 59 | Source: Ambulatory Visit | Attending: General Surgery | Admitting: General Surgery

## 2015-02-11 ENCOUNTER — Other Ambulatory Visit: Payer: Self-pay

## 2015-02-11 ENCOUNTER — Other Ambulatory Visit: Payer: Self-pay | Admitting: General Surgery

## 2015-02-11 DIAGNOSIS — R9431 Abnormal electrocardiogram [ECG] [EKG]: Secondary | ICD-10-CM | POA: Diagnosis not present

## 2015-02-11 DIAGNOSIS — I341 Nonrheumatic mitral (valve) prolapse: Secondary | ICD-10-CM | POA: Diagnosis not present

## 2015-02-11 DIAGNOSIS — F17200 Nicotine dependence, unspecified, uncomplicated: Secondary | ICD-10-CM | POA: Insufficient documentation

## 2015-02-11 DIAGNOSIS — Z01812 Encounter for preprocedural laboratory examination: Secondary | ICD-10-CM | POA: Insufficient documentation

## 2015-02-11 DIAGNOSIS — Z01818 Encounter for other preprocedural examination: Secondary | ICD-10-CM | POA: Diagnosis not present

## 2015-02-11 DIAGNOSIS — Z79899 Other long term (current) drug therapy: Secondary | ICD-10-CM | POA: Insufficient documentation

## 2015-02-11 DIAGNOSIS — C50911 Malignant neoplasm of unspecified site of right female breast: Secondary | ICD-10-CM | POA: Insufficient documentation

## 2015-02-11 DIAGNOSIS — I10 Essential (primary) hypertension: Secondary | ICD-10-CM | POA: Insufficient documentation

## 2015-02-11 HISTORY — DX: Other specified postprocedural states: Z98.890

## 2015-02-11 HISTORY — DX: Other specified postprocedural states: R11.2

## 2015-02-11 HISTORY — DX: Anxiety disorder, unspecified: F41.9

## 2015-02-11 HISTORY — DX: Essential (primary) hypertension: I10

## 2015-02-11 HISTORY — DX: Adverse effect of unspecified anesthetic, initial encounter: T41.45XA

## 2015-02-11 HISTORY — DX: Other complications of anesthesia, initial encounter: T88.59XA

## 2015-02-11 LAB — CBC
HEMATOCRIT: 41.2 % (ref 36.0–46.0)
HEMOGLOBIN: 13.9 g/dL (ref 12.0–15.0)
MCH: 32.9 pg (ref 26.0–34.0)
MCHC: 33.7 g/dL (ref 30.0–36.0)
MCV: 97.6 fL (ref 78.0–100.0)
Platelets: 135 10*3/uL — ABNORMAL LOW (ref 150–400)
RBC: 4.22 MIL/uL (ref 3.87–5.11)
RDW: 13.3 % (ref 11.5–15.5)
WBC: 8.5 10*3/uL (ref 4.0–10.5)

## 2015-02-11 LAB — BASIC METABOLIC PANEL
Anion gap: 6 (ref 5–15)
BUN: 13 mg/dL (ref 6–20)
CALCIUM: 9.3 mg/dL (ref 8.9–10.3)
CO2: 27 mmol/L (ref 22–32)
Chloride: 103 mmol/L (ref 101–111)
Creatinine, Ser: 1.16 mg/dL — ABNORMAL HIGH (ref 0.44–1.00)
GFR calc Af Amer: 59 mL/min — ABNORMAL LOW (ref >60)
GFR, EST NON AFRICAN AMERICAN: 50 mL/min — AB (ref >60)
Glucose, Bld: 92 mg/dL (ref 65–99)
Potassium: 4.1 mmol/L (ref 3.5–5.1)
Sodium: 136 mmol/L (ref 135–145)

## 2015-02-11 NOTE — Progress Notes (Signed)
SPOKE WITH DR. Ethlyn Gallery OFFICE AND ASKED THAT CONSENT ORDER BE CORRECTED.  PATIENT STATES SHOULD BE BILATERAL.  ALSO SPOKE WITH KATIE AT [place] OFFICE ABOUT NEEDING ORDERS.

## 2015-02-11 NOTE — Pre-Procedure Instructions (Addendum)
Kristin Adams  02/11/2015      [facility] - [place] [id number] [place]) [place] [address] Phone: [phone] Fax: [phone]  [place] [address] Phone: [phone] Fax: [phone]    Your procedure is scheduled on Thursday  Dec 15th @1130   Report to [facility] at 930 A.M.  Call this number if you have problems the morning of surgery:  [phone]   Remember:  Do not eat food or drink liquids after midnight.  Take these medicines the morning of surgery with A SIP OF WATER: Benadryl if needed, escitalopram (Lexapro), Hydrocodone-acetaminophen (Norco/Vicodin) if needed, metoprolol(Toprol-XL), ondansetron (Zofran) if needed  Stop taking aspirin, Ibuprofen, Motrin, Advil, BC's, Goody's, Aleve, Herbal medications, Fish Oil, Vitamins    Do not wear jewelry, make-up or nail polish.  Do not wear lotions, powders, or perfumes.  You may wear deodorant.  Do not shave 48 hours prior to surgery.  Men may shave face and neck.  Do not bring valuables to the hospital.  [facility] is not responsible for any belongings or valuables.  Contacts, dentures or bridgework may not be worn into surgery.  Leave your suitcase in the car.  After surgery it may be brought to your room.  For patients admitted to the hospital, discharge time will be determined by your treatment team.  Patients discharged the day of surgery will not be allowed to drive home.    Special instructions:  [facility] - Preparing for Surgery  Before surgery, you can play an important role.  Because skin is not sterile, your skin needs to be as free of germs as possible.  You can reduce the number of germs on you skin by washing with CHG (chlorahexidine gluconate) soap before surgery.  CHG is an antiseptic cleaner which kills germs and bonds with the skin to  continue killing germs even after washing.  Please DO NOT use if you have an allergy to CHG or antibacterial soaps.  If your skin becomes reddened/irritated stop using the CHG and inform your nurse when you arrive at Short Stay.  Do not shave (including legs and underarms) for at least 48 hours prior to the first CHG shower.  You may shave your face.  Please follow these instructions carefully:   1.  Shower with CHG Soap the night before surgery and the   morning of Surgery.  2.  If you choose to wash your hair, wash your hair first as usual with your   normal shampoo.  3.  After you shampoo, rinse your hair and body thoroughly to remove the Shampoo.  4.  Use CHG as you would any other liquid soap.  You can apply chg directly   to the skin and wash gently with scrungie or a clean washcloth.  5.  Apply the CHG Soap to your body ONLY FROM THE NECK DOWN.     Do not use on open wounds or open sores.  Avoid contact with your eyes,  ears, mouth and genitals (private parts).  Wash genitals (private parts) with your normal soap.  6.  Wash thoroughly, paying special attention to the area where your surgery  will be performed.  7.  Thoroughly rinse your body with warm water from the neck down.  8.  DO NOT shower/wash with your normal soap after using and  rinsing off   the CHG Soap.  9.  Pat yourself dry with a clean towel.            10.  Wear clean pajamas.            11.  Place clean sheets on your bed the night of your first shower and do not sleep with pets.  Day of Surgery  Do not apply any lotions/deoderants the morning of surgery.  Please wear clean clothes to the hospital/surgery center.     Please read over the following fact sheets that you were given. Pain Booklet, Coughing and Deep Breathing and Surgical Site Infection Prevention

## 2015-02-12 NOTE — Progress Notes (Signed)
Anesthesia Chart Review:  Pt is 59 year old female scheduled for R mastectomy with sentinel lymph node biopsy, prophylactic L mastectomy, immediate B breast reconstruction with placement of tissue expander and flex HD on 02/19/2015 with Dr. Marlou Starks and Dr. Marla Roe.   PMH includes:  HTN, MVP, breast cancer, post-op N/V. Current smoker. BMI 25.   Medications include: metoprolol.   Preoperative labs reviewed.    CT chest w/o contrast 07/07/14: 1. No discrete pulmonary nodule. Increased scarring or atelectasis in the medial right upper lobe and lingula since 2011. 2. Stable probable post radiation subpleural changes in the anterior left lung. 3. Stable postoperative appearance of the the left axilla.   EKG 02/11/15: NSR. Possible Anterior infarct, age undetermined. No significant change since previous EKG 05/15/09 per Dr. Nelly Laurence interpretation.   Echo 04/07/04:  - Overall left ventricular systolic function was normal. Left ventricular ejection fraction was estimated , range being 55% to 60%.  If no changes, I anticipate pt can proceed with surgery as scheduled.   Willeen Cass, FNP-BC [facility] Short Stay Surgical Center/Anesthesiology Phone: [phone] 02/12/2015 4:45 PM

## 2015-02-13 ENCOUNTER — Ambulatory Visit: Payer: 59 | Admitting: Oncology

## 2015-02-17 ENCOUNTER — Other Ambulatory Visit: Payer: Self-pay | Admitting: Plastic Surgery

## 2015-02-17 DIAGNOSIS — C50911 Malignant neoplasm of unspecified site of right female breast: Secondary | ICD-10-CM

## 2015-02-18 MED ORDER — CEFAZOLIN SODIUM-DEXTROSE 2-3 GM-% IV SOLR
2.0000 g | INTRAVENOUS | Status: DC
  Filled 2015-02-18: qty 50

## 2015-02-18 MED ORDER — CEFAZOLIN SODIUM-DEXTROSE 2-3 GM-% IV SOLR
2.0000 g | INTRAVENOUS | Status: AC
  Administered 2015-02-19: 2 g via INTRAVENOUS

## 2015-02-19 ENCOUNTER — Ambulatory Visit (HOSPITAL_COMMUNITY): Payer: 59 | Admitting: Anesthesiology

## 2015-02-19 ENCOUNTER — Encounter (HOSPITAL_COMMUNITY)
Admission: RE | Admit: 2015-02-19 | Discharge: 2015-02-19 | Disposition: A | Discharge status: Home / Self Care | Payer: 59 | Source: Ambulatory Visit | Attending: General Surgery | Admitting: General Surgery

## 2015-02-19 ENCOUNTER — Observation Stay (HOSPITAL_COMMUNITY)
Admission: RE | Admit: 2015-02-19 | Discharge: 2015-02-20 | Disposition: A | Discharge status: Home / Self Care | Payer: 59 | Source: Ambulatory Visit | Attending: Plastic Surgery | Admitting: Plastic Surgery

## 2015-02-19 ENCOUNTER — Encounter (HOSPITAL_COMMUNITY)
Admission: RE | Disposition: A | Discharge status: Home / Self Care | Payer: Self-pay | Source: Ambulatory Visit | Attending: Plastic Surgery

## 2015-02-19 ENCOUNTER — Encounter (HOSPITAL_COMMUNITY): Payer: Self-pay | Admitting: General Practice

## 2015-02-19 ENCOUNTER — Ambulatory Visit (HOSPITAL_COMMUNITY): Payer: 59 | Admitting: Emergency Medicine

## 2015-02-19 DIAGNOSIS — Z4001 Encounter for prophylactic removal of breast: Secondary | ICD-10-CM | POA: Diagnosis not present

## 2015-02-19 DIAGNOSIS — F419 Anxiety disorder, unspecified: Secondary | ICD-10-CM | POA: Diagnosis not present

## 2015-02-19 DIAGNOSIS — D0511 Intraductal carcinoma in situ of right breast: Secondary | ICD-10-CM | POA: Diagnosis not present

## 2015-02-19 DIAGNOSIS — Z79899 Other long term (current) drug therapy: Secondary | ICD-10-CM | POA: Insufficient documentation

## 2015-02-19 DIAGNOSIS — F17200 Nicotine dependence, unspecified, uncomplicated: Secondary | ICD-10-CM | POA: Insufficient documentation

## 2015-02-19 DIAGNOSIS — C50919 Malignant neoplasm of unspecified site of unspecified female breast: Secondary | ICD-10-CM | POA: Diagnosis present

## 2015-02-19 DIAGNOSIS — N6022 Fibroadenosis of left breast: Secondary | ICD-10-CM | POA: Insufficient documentation

## 2015-02-19 DIAGNOSIS — D0591 Unspecified type of carcinoma in situ of right breast: Secondary | ICD-10-CM | POA: Diagnosis present

## 2015-02-19 DIAGNOSIS — M1990 Unspecified osteoarthritis, unspecified site: Secondary | ICD-10-CM | POA: Insufficient documentation

## 2015-02-19 DIAGNOSIS — I10 Essential (primary) hypertension: Secondary | ICD-10-CM | POA: Diagnosis not present

## 2015-02-19 DIAGNOSIS — Z853 Personal history of malignant neoplasm of breast: Secondary | ICD-10-CM | POA: Insufficient documentation

## 2015-02-19 DIAGNOSIS — C50911 Malignant neoplasm of unspecified site of right female breast: Secondary | ICD-10-CM

## 2015-02-19 HISTORY — PX: BREAST RECONSTRUCTION WITH PLACEMENT OF TISSUE EXPANDER AND FLEX HD (ACELLULAR HYDRATED DERMIS): SHX6295

## 2015-02-19 HISTORY — PX: MASTECTOMY, PARTIAL: SHX709

## 2015-02-19 HISTORY — PX: MASTECTOMY W/ SENTINEL NODE BIOPSY: SHX2001

## 2015-02-19 SURGERY — MASTECTOMY WITH SENTINEL LYMPH NODE BIOPSY
Anesthesia: Regional | Site: Breast | Laterality: Right

## 2015-02-19 MED ORDER — BUPIVACAINE-EPINEPHRINE (PF) 0.5% -1:200000 IJ SOLN
INTRAMUSCULAR | Status: DC | PRN
  Administered 2015-02-19 (×2): 20 mL via PERINEURAL

## 2015-02-19 MED ORDER — METOPROLOL SUCCINATE ER 25 MG PO TB24
25.0000 mg | ORAL_TABLET | Freq: Every day | ORAL | Status: DC
  Administered 2015-02-19: 25 mg via ORAL
  Filled 2015-02-19 (×2): qty 1

## 2015-02-19 MED ORDER — DIPHENHYDRAMINE HCL 50 MG/ML IJ SOLN
12.5000 mg | Freq: Once | INTRAMUSCULAR | Status: AC
  Administered 2015-02-19: 25 mg via INTRAVENOUS

## 2015-02-19 MED ORDER — OXYCODONE HCL 5 MG PO TABS
ORAL_TABLET | ORAL | Status: AC
  Filled 2015-02-19: qty 1

## 2015-02-19 MED ORDER — FENTANYL CITRATE (PF) 250 MCG/5ML IJ SOLN
INTRAMUSCULAR | Status: AC
  Filled 2015-02-19: qty 5

## 2015-02-19 MED ORDER — ESCITALOPRAM OXALATE 20 MG PO TABS
20.0000 mg | ORAL_TABLET | Freq: Every day | ORAL | Status: DC
  Administered 2015-02-19: 20 mg via ORAL
  Filled 2015-02-19: qty 1

## 2015-02-19 MED ORDER — DIPHENHYDRAMINE HCL 50 MG/ML IJ SOLN
12.5000 mg | Freq: Four times a day (QID) | INTRAMUSCULAR | Status: DC | PRN
  Administered 2015-02-19: 12.5 mg via INTRAVENOUS
  Filled 2015-02-19: qty 1

## 2015-02-19 MED ORDER — FENTANYL CITRATE (PF) 100 MCG/2ML IJ SOLN
INTRAMUSCULAR | Status: AC
  Administered 2015-02-19: 100 ug via INTRAVENOUS
  Filled 2015-02-19: qty 2

## 2015-02-19 MED ORDER — ROCURONIUM BROMIDE 100 MG/10ML IV SOLN
INTRAVENOUS | Status: DC | PRN
  Administered 2015-02-19: 50 mg via INTRAVENOUS
  Administered 2015-02-19: 10 mg via INTRAVENOUS

## 2015-02-19 MED ORDER — DIPHENHYDRAMINE HCL 50 MG/ML IJ SOLN
INTRAMUSCULAR | Status: AC
  Filled 2015-02-19: qty 1

## 2015-02-19 MED ORDER — MORPHINE SULFATE (PF) 2 MG/ML IV SOLN
INTRAVENOUS | Status: AC
  Administered 2015-02-19: 2 mg via INTRAVENOUS
  Filled 2015-02-19: qty 1

## 2015-02-19 MED ORDER — SODIUM CHLORIDE 0.9 % IJ SOLN
INTRAMUSCULAR | Status: AC
  Filled 2015-02-19: qty 10

## 2015-02-19 MED ORDER — MIDAZOLAM HCL 5 MG/5ML IJ SOLN
INTRAMUSCULAR | Status: DC | PRN
  Administered 2015-02-19: 2 mg via INTRAVENOUS

## 2015-02-19 MED ORDER — ALPRAZOLAM 0.5 MG PO TABS: 0.5000 mg | ORAL_TABLET | Freq: Every evening | ORAL | Status: DC | PRN

## 2015-02-19 MED ORDER — PROPOFOL 10 MG/ML IV BOLUS
INTRAVENOUS | Status: AC
  Filled 2015-02-19: qty 20

## 2015-02-19 MED ORDER — PROPOFOL 10 MG/ML IV BOLUS
INTRAVENOUS | Status: DC | PRN
  Administered 2015-02-19: 200 mg via INTRAVENOUS

## 2015-02-19 MED ORDER — MORPHINE SULFATE (PF) 2 MG/ML IV SOLN
2.0000 mg | INTRAVENOUS | Status: DC | PRN
  Administered 2015-02-19 – 2015-02-20 (×2): 2 mg via INTRAVENOUS
  Filled 2015-02-19 (×2): qty 1

## 2015-02-19 MED ORDER — CEFAZOLIN SODIUM-DEXTROSE 2-3 GM-% IV SOLR
2.0000 g | Freq: Three times a day (TID) | INTRAVENOUS | Status: DC
  Administered 2015-02-19 – 2015-02-20 (×2): 2 g via INTRAVENOUS
  Filled 2015-02-19 (×4): qty 50

## 2015-02-19 MED ORDER — CHLORHEXIDINE GLUCONATE 4 % EX LIQD: 1.0000 "application " | Freq: Once | CUTANEOUS | Status: DC

## 2015-02-19 MED ORDER — MIDAZOLAM HCL 2 MG/2ML IJ SOLN
INTRAMUSCULAR | Status: AC
  Filled 2015-02-19: qty 2

## 2015-02-19 MED ORDER — SODIUM CHLORIDE 0.9 % IR SOLN
Status: DC | PRN
  Administered 2015-02-19: 1000 mL

## 2015-02-19 MED ORDER — ONDANSETRON HCL 4 MG/2ML IJ SOLN: 4.0000 mg | Freq: Four times a day (QID) | INTRAMUSCULAR | Status: DC | PRN

## 2015-02-19 MED ORDER — NAPROXEN 250 MG PO TABS: 500.0000 mg | ORAL_TABLET | Freq: Two times a day (BID) | ORAL | Status: DC | PRN

## 2015-02-19 MED ORDER — NEOSTIGMINE METHYLSULFATE 10 MG/10ML IV SOLN
INTRAVENOUS | Status: AC
  Filled 2015-02-19: qty 1

## 2015-02-19 MED ORDER — NEOSTIGMINE METHYLSULFATE 10 MG/10ML IV SOLN
INTRAVENOUS | Status: DC | PRN
  Administered 2015-02-19: 3 mg via INTRAVENOUS

## 2015-02-19 MED ORDER — MULTIVITAMIN PO LIQD: Freq: Every day | ORAL | Status: DC

## 2015-02-19 MED ORDER — ONDANSETRON 4 MG PO TBDP: 4.0000 mg | ORAL_TABLET | Freq: Four times a day (QID) | ORAL | Status: DC | PRN

## 2015-02-19 MED ORDER — ADULT MULTIVITAMIN LIQUID CH
5.0000 mL | Freq: Every day | ORAL | Status: DC
  Filled 2015-02-19: qty 5

## 2015-02-19 MED ORDER — KCL IN DEXTROSE-NACL 20-5-0.45 MEQ/L-%-% IV SOLN
INTRAVENOUS | Status: DC
  Administered 2015-02-19: 19:00:00 via INTRAVENOUS
  Filled 2015-02-19 (×2): qty 1000

## 2015-02-19 MED ORDER — DIAZEPAM 2 MG PO TABS: 2.0000 mg | ORAL_TABLET | Freq: Two times a day (BID) | ORAL | Status: DC | PRN

## 2015-02-19 MED ORDER — MORPHINE SULFATE (PF) 2 MG/ML IV SOLN
2.0000 mg | INTRAVENOUS | Status: AC
  Administered 2015-02-19 (×4): 2 mg via INTRAVENOUS

## 2015-02-19 MED ORDER — OXYCODONE HCL 5 MG/5ML PO SOLN: 5.0000 mg | Freq: Once | ORAL | Status: AC | PRN

## 2015-02-19 MED ORDER — FENTANYL CITRATE (PF) 100 MCG/2ML IJ SOLN
25.0000 ug | INTRAMUSCULAR | Status: DC | PRN
  Administered 2015-02-19 (×3): 50 ug via INTRAVENOUS

## 2015-02-19 MED ORDER — DIPHENHYDRAMINE HCL 12.5 MG/5ML PO ELIX
12.5000 mg | ORAL_SOLUTION | Freq: Four times a day (QID) | ORAL | Status: DC | PRN
  Administered 2015-02-20: 12.5 mg via ORAL
  Filled 2015-02-19: qty 10

## 2015-02-19 MED ORDER — EPHEDRINE SULFATE 50 MG/ML IJ SOLN
INTRAMUSCULAR | Status: DC | PRN
  Administered 2015-02-19: 10 mg via INTRAVENOUS
  Administered 2015-02-19: 5 mg via INTRAVENOUS

## 2015-02-19 MED ORDER — SCOPOLAMINE 1 MG/3DAYS TD PT72
1.0000 | MEDICATED_PATCH | Freq: Once | TRANSDERMAL | Status: DC
  Administered 2015-02-19: 1.5 mg via TRANSDERMAL
  Filled 2015-02-19: qty 1

## 2015-02-19 MED ORDER — ACETAMINOPHEN 500 MG PO TABS
1000.0000 mg | ORAL_TABLET | Freq: Four times a day (QID) | ORAL | Status: DC
  Administered 2015-02-19 (×2): 1000 mg via ORAL
  Filled 2015-02-19 (×2): qty 2

## 2015-02-19 MED ORDER — GLYCOPYRROLATE 0.2 MG/ML IJ SOLN
INTRAMUSCULAR | Status: DC | PRN
  Administered 2015-02-19: 0.4 mg via INTRAVENOUS

## 2015-02-19 MED ORDER — ALBUMIN HUMAN 5 % IV SOLN
INTRAVENOUS | Status: DC | PRN
  Administered 2015-02-19: 13:00:00 via INTRAVENOUS

## 2015-02-19 MED ORDER — SCOPOLAMINE 1 MG/3DAYS TD PT72
MEDICATED_PATCH | TRANSDERMAL | Status: AC
  Filled 2015-02-19: qty 1

## 2015-02-19 MED ORDER — FENTANYL CITRATE (PF) 100 MCG/2ML IJ SOLN
INTRAMUSCULAR | Status: AC
  Filled 2015-02-19: qty 2

## 2015-02-19 MED ORDER — METHYLENE BLUE 1 % INJ SOLN
INTRAMUSCULAR | Status: AC
  Filled 2015-02-19: qty 10

## 2015-02-19 MED ORDER — LACTATED RINGERS IV SOLN
INTRAVENOUS | Status: DC | PRN
  Administered 2015-02-19 (×3): via INTRAVENOUS

## 2015-02-19 MED ORDER — POLYETHYLENE GLYCOL 3350 17 G PO PACK
17.0000 g | PACK | Freq: Every day | ORAL | Status: DC
  Administered 2015-02-19: 17 g via ORAL
  Filled 2015-02-19 (×2): qty 1

## 2015-02-19 MED ORDER — BISACODYL 5 MG PO TBEC: 5.0000 mg | DELAYED_RELEASE_TABLET | Freq: Every day | ORAL | Status: DC | PRN

## 2015-02-19 MED ORDER — PHENYLEPHRINE HCL 10 MG/ML IJ SOLN
INTRAMUSCULAR | Status: DC | PRN
  Administered 2015-02-19: 40 ug via INTRAVENOUS

## 2015-02-19 MED ORDER — GLYCOPYRROLATE 0.2 MG/ML IJ SOLN
INTRAMUSCULAR | Status: AC
  Filled 2015-02-19: qty 2

## 2015-02-19 MED ORDER — FENTANYL CITRATE (PF) 100 MCG/2ML IJ SOLN
100.0000 ug | Freq: Once | INTRAMUSCULAR | Status: AC
  Administered 2015-02-19: 100 ug via INTRAVENOUS

## 2015-02-19 MED ORDER — ONDANSETRON HCL 4 MG/2ML IJ SOLN
INTRAMUSCULAR | Status: DC | PRN
  Administered 2015-02-19: 4 mg via INTRAVENOUS

## 2015-02-19 MED ORDER — FENTANYL CITRATE (PF) 100 MCG/2ML IJ SOLN
INTRAMUSCULAR | Status: AC
  Administered 2015-02-19: 50 ug via INTRAVENOUS
  Filled 2015-02-19: qty 2

## 2015-02-19 MED ORDER — OXYCODONE HCL 5 MG PO TABS
5.0000 mg | ORAL_TABLET | Freq: Once | ORAL | Status: AC | PRN
  Administered 2015-02-19: 5 mg via ORAL

## 2015-02-19 MED ORDER — MIDAZOLAM HCL 2 MG/2ML IJ SOLN
INTRAMUSCULAR | Status: AC
  Administered 2015-02-19: 2 mg
  Filled 2015-02-19: qty 2

## 2015-02-19 MED ORDER — LACTATED RINGERS IV SOLN
INTRAVENOUS | Status: DC
  Administered 2015-02-19: 10:00:00 via INTRAVENOUS

## 2015-02-19 MED ORDER — SODIUM CHLORIDE 0.9 % IR SOLN
Status: DC | PRN
  Administered 2015-02-19: 500 mL

## 2015-02-19 MED ORDER — TECHNETIUM TC 99M SULFUR COLLOID FILTERED
1.0000 | Freq: Once | INTRAVENOUS | Status: AC | PRN
  Administered 2015-02-19: 1 via INTRADERMAL

## 2015-02-19 MED ORDER — LIDOCAINE HCL (CARDIAC) 20 MG/ML IV SOLN
INTRAVENOUS | Status: DC | PRN
  Administered 2015-02-19: 75 mg via INTRAVENOUS

## 2015-02-19 MED ORDER — MIDAZOLAM HCL 5 MG/ML IJ SOLN: 2.0000 mg | Freq: Once | INTRAMUSCULAR | Status: DC

## 2015-02-19 MED ORDER — FENTANYL CITRATE (PF) 100 MCG/2ML IJ SOLN
INTRAMUSCULAR | Status: DC | PRN
  Administered 2015-02-19 (×3): 50 ug via INTRAVENOUS
  Administered 2015-02-19: 100 ug via INTRAVENOUS

## 2015-02-19 MED ORDER — MORPHINE SULFATE (PF) 2 MG/ML IV SOLN
INTRAVENOUS | Status: AC
  Administered 2015-02-19: 2 mg via INTRAVENOUS
  Filled 2015-02-19: qty 2

## 2015-02-19 MED ORDER — 0.9 % SODIUM CHLORIDE (POUR BTL) OPTIME
TOPICAL | Status: DC | PRN
  Administered 2015-02-19 (×4): 1000 mL

## 2015-02-19 MED ORDER — OXYCODONE-ACETAMINOPHEN 5-325 MG PO TABS
1.0000 | ORAL_TABLET | ORAL | Status: DC | PRN
  Administered 2015-02-20 (×2): 2 via ORAL
  Filled 2015-02-19 (×3): qty 2

## 2015-02-19 SURGICAL SUPPLY — 86 items
ADH SKN CLS APL DERMABOND .7 (GAUZE/BANDAGES/DRESSINGS) ×2
APPLIER CLIP 9.375 MED OPEN (MISCELLANEOUS) ×4
APR CLP MED 9.3 20 MLT OPN (MISCELLANEOUS) ×3
BAG DECANTER FOR FLEXI CONT (MISCELLANEOUS) ×4 IMPLANT
BINDER BREAST LRG (GAUZE/BANDAGES/DRESSINGS) ×4 IMPLANT
BINDER BREAST XLRG (GAUZE/BANDAGES/DRESSINGS) IMPLANT
BIOPATCH RED 1 DISK 7.0 (GAUZE/BANDAGES/DRESSINGS) ×8 IMPLANT
BLADE 10 SAFETY STRL DISP (BLADE) ×4 IMPLANT
CANISTER SUCTION 2500CC (MISCELLANEOUS) ×12 IMPLANT
CHLORAPREP W/TINT 26ML (MISCELLANEOUS) ×8 IMPLANT
CLIP APPLIE 9.375 MED OPEN (MISCELLANEOUS) ×3 IMPLANT
CONT SPEC 4OZ CLIKSEAL STRL BL (MISCELLANEOUS) ×4 IMPLANT
COVER PROBE W GEL 5X96 (DRAPES) ×4 IMPLANT
COVER SURGICAL LIGHT HANDLE (MISCELLANEOUS) ×8 IMPLANT
DECANTER SPIKE VIAL GLASS SM (MISCELLANEOUS) ×4 IMPLANT
DERMABOND ADVANCED (GAUZE/BANDAGES/DRESSINGS) ×1
DERMABOND ADVANCED .7 DNX12 (GAUZE/BANDAGES/DRESSINGS) ×3 IMPLANT
DEVICE DISSECT PLASMABLAD 3.0S (MISCELLANEOUS) ×3 IMPLANT
DEVICE DUBIN SPECIMEN MAMMOGRA (MISCELLANEOUS) IMPLANT
DRAIN CHANNEL 19F RND (DRAIN) ×8 IMPLANT
DRAPE LAPAROSCOPIC ABDOMINAL (DRAPES) ×4 IMPLANT
DRAPE ORTHO SPLIT 77X108 STRL (DRAPES) ×8
DRAPE PROXIMA HALF (DRAPES) ×4 IMPLANT
DRAPE SURG 17X23 STRL (DRAPES) IMPLANT
DRAPE SURG ORHT 6 SPLT 77X108 (DRAPES) ×6 IMPLANT
DRAPE UTILITY XL STRL (DRAPES) ×8 IMPLANT
DRAPE WARM FLUID 44X44 (DRAPE) ×4 IMPLANT
DRSG PAD ABDOMINAL 8X10 ST (GAUZE/BANDAGES/DRESSINGS) ×12 IMPLANT
DRSG TEGADERM 4X4.75 (GAUZE/BANDAGES/DRESSINGS) IMPLANT
ELECT BLADE 4.0 EZ CLEAN MEGAD (MISCELLANEOUS) ×4
ELECT CAUTERY BLADE 6.4 (BLADE) ×4 IMPLANT
ELECT REM PT RETURN 9FT ADLT (ELECTROSURGICAL) ×8
ELECTRODE BLDE 4.0 EZ CLN MEGD (MISCELLANEOUS) ×3 IMPLANT
ELECTRODE REM PT RTRN 9FT ADLT (ELECTROSURGICAL) ×6 IMPLANT
EVACUATOR SILICONE 100CC (DRAIN) ×8 IMPLANT
GAUZE SPONGE 4X4 12PLY STRL (GAUZE/BANDAGES/DRESSINGS) ×8 IMPLANT
GAUZE SPONGE 4X4 16PLY XRAY LF (GAUZE/BANDAGES/DRESSINGS) ×4 IMPLANT
GAUZE XEROFORM 5X9 LF (GAUZE/BANDAGES/DRESSINGS) ×4 IMPLANT
GLOVE BIO SURGEON STRL SZ 6.5 (GLOVE) ×4 IMPLANT
GLOVE BIO SURGEON STRL SZ7 (GLOVE) ×4 IMPLANT
GLOVE BIO SURGEON STRL SZ7.5 (GLOVE) ×8 IMPLANT
GLOVE BIOGEL PI IND STRL 7.0 (GLOVE) ×3 IMPLANT
GLOVE BIOGEL PI IND STRL 7.5 (GLOVE) ×3 IMPLANT
GLOVE BIOGEL PI INDICATOR 7.0 (GLOVE) ×1
GLOVE BIOGEL PI INDICATOR 7.5 (GLOVE) ×1
GLOVE SURG SS PI 7.0 STRL IVOR (GLOVE) ×8 IMPLANT
GOWN STRL REUS W/ TWL LRG LVL3 (GOWN DISPOSABLE) ×12 IMPLANT
GOWN STRL REUS W/TWL LRG LVL3 (GOWN DISPOSABLE) ×8
GRAFT FLEX HD BILAT 4X16 THICK (Tissue Mesh) ×8 IMPLANT
IMPL BREAST TIS EXP M 350CC (Breast) ×6 IMPLANT
IMPLANT BREAST TIS EXP M 350CC (Breast) ×8 IMPLANT
KIT BASIN OR (CUSTOM PROCEDURE TRAY) ×8 IMPLANT
KIT ROOM TURNOVER OR (KITS) ×8 IMPLANT
LIQUID BAND (GAUZE/BANDAGES/DRESSINGS) ×4 IMPLANT
MARKER SKIN DUAL TIP RULER LAB (MISCELLANEOUS) ×4 IMPLANT
NEEDLE 18GX1X1/2 (RX/OR ONLY) (NEEDLE) IMPLANT
NEEDLE 22X1 1/2 (OR ONLY) (NEEDLE) ×4 IMPLANT
NEEDLE HYPO 25GX1X1/2 BEV (NEEDLE) ×4 IMPLANT
NS IRRIG 1000ML POUR BTL (IV SOLUTION) ×12 IMPLANT
PACK GENERAL/GYN (CUSTOM PROCEDURE TRAY) ×8 IMPLANT
PACK SURGICAL SETUP 50X90 (CUSTOM PROCEDURE TRAY) ×4 IMPLANT
PAD ABD 8X10 STRL (GAUZE/BANDAGES/DRESSINGS) ×4 IMPLANT
PAD ARMBOARD 7.5X6 YLW CONV (MISCELLANEOUS) ×8 IMPLANT
PENCIL BUTTON HOLSTER BLD 10FT (ELECTRODE) ×4 IMPLANT
PIN SAFETY STERILE (MISCELLANEOUS) ×4 IMPLANT
PLASMABLADE 3.0S (MISCELLANEOUS) ×4
SET ASEPTIC TRANSFER (MISCELLANEOUS) IMPLANT
SPECIMEN JAR X LARGE (MISCELLANEOUS) ×8 IMPLANT
SPONGE GAUZE 4X4 12PLY STER LF (GAUZE/BANDAGES/DRESSINGS) ×4 IMPLANT
STAPLER VISISTAT 35W (STAPLE) IMPLANT
SUT ETHILON 3 0 FSL (SUTURE) IMPLANT
SUT MNCRL AB 4-0 PS2 18 (SUTURE) ×8 IMPLANT
SUT MON AB 3-0 SH 27 (SUTURE) ×8
SUT MON AB 3-0 SH27 (SUTURE) ×6 IMPLANT
SUT MON AB 4-0 PC3 18 (SUTURE) IMPLANT
SUT MON AB 5-0 PS2 18 (SUTURE) ×8 IMPLANT
SUT PDS AB 2-0 CT1 27 (SUTURE) ×20 IMPLANT
SUT SILK 3 0 SH 30 (SUTURE) ×8 IMPLANT
SUT VIC AB 3-0 54X BRD REEL (SUTURE) IMPLANT
SUT VIC AB 3-0 BRD 54 (SUTURE)
SUT VIC AB 3-0 SH 18 (SUTURE) ×4 IMPLANT
SYR CONTROL 10ML LL (SYRINGE) IMPLANT
TOWEL OR 17X24 6PK STRL BLUE (TOWEL DISPOSABLE) ×4 IMPLANT
TOWEL OR 17X26 10 PK STRL BLUE (TOWEL DISPOSABLE) ×4 IMPLANT
TRAY FOLEY CATH 14FRSI W/METER (CATHETERS) ×4 IMPLANT
TUBE CONNECTING 12X1/4 (SUCTIONS) ×4 IMPLANT

## 2015-02-19 NOTE — Anesthesia Preprocedure Evaluation (Addendum)
Anesthesia Evaluation  Patient identified by MRN, date of birth, ID band Patient awake    Reviewed: Allergy & Precautions, H&P , NPO status , Patient's Chart, lab work & pertinent test results  History of Anesthesia Complications (+) history of anesthetic complications  Airway Mallampati: II   Neck ROM: full    Dental  (+) Teeth Intact, Caps, Dental Advidsory Given   Pulmonary Current Smoker,    breath sounds clear to auscultation       Cardiovascular hypertension, On Home Beta Blockers  Rhythm:regular Rate:Normal     Neuro/Psych  Headaches, Anxiety    GI/Hepatic   Endo/Other    Renal/GU      Musculoskeletal  (+) Arthritis ,   Abdominal   Peds  Hematology   Anesthesia Other Findings Recent cap to right front tooth.  Reproductive/Obstetrics                            Anesthesia Physical Anesthesia Plan  ASA: II  Anesthesia Plan: General and Regional   Post-op Pain Management: MAC Combined w/ Regional for Post-op pain   Induction: Intravenous  Airway Management Planned: Oral ETT  Additional Equipment:   Intra-op Plan:   Post-operative Plan: Extubation in OR  Informed Consent: I have reviewed the patients History and Physical, chart, labs and discussed the procedure including the risks, benefits and alternatives for the proposed anesthesia with the patient or authorized representative who has indicated his/her understanding and acceptance.   Dental Advisory Given  Plan Discussed with: CRNA, Anesthesiologist and Surgeon  Anesthesia Plan Comments:        Anesthesia Quick Evaluation

## 2015-02-19 NOTE — Anesthesia Postprocedure Evaluation (Signed)
Anesthesia Post Note  Patient: Kristin Adams  Procedure(s) Performed: Procedure(s) (LRB): RIGHT MASTECTOMY WITH SENTINEL LYMPH NODE BIOPSY (Right) LEFT MASTECTOMY PROPHYLACTIC (Left) IMMEDIATE BILATERAL BREAST RECONSTRUCTION WITH PLACEMENT OF TISSUE EXPANDER AND FLEX HD (ACELLULAR HYDRATED DERMIS) (Bilateral)  Patient location during evaluation: PACU Anesthesia Type: General Level of consciousness: awake and alert Pain management: pain level controlled Vital Signs Assessment: post-procedure vital signs reviewed and stable Respiratory status: spontaneous breathing, nonlabored ventilation and respiratory function stable Cardiovascular status: blood pressure returned to baseline and stable Postop Assessment: no signs of nausea or vomiting Anesthetic complications: no    Last Vitals:  Filed Vitals:   02/19/15 1631 02/19/15 1656  BP:  134/71  Pulse:  71  Temp: 36.1 C 36.1 C  Resp:  18    Last Pain:  Filed Vitals:   02/19/15 1828  PainSc: Asleep                 [place] Yard A

## 2015-02-19 NOTE — Transfer of Care (Signed)
Immediate Anesthesia Transfer of Care Note  Patient: Kristin Adams  Procedure(s) Performed: Procedure(s): RIGHT MASTECTOMY WITH SENTINEL LYMPH NODE BIOPSY (Right) LEFT MASTECTOMY PROPHYLACTIC (Left) IMMEDIATE BILATERAL BREAST RECONSTRUCTION WITH PLACEMENT OF TISSUE EXPANDER AND FLEX HD (ACELLULAR HYDRATED DERMIS) (Bilateral)  Patient Location: PACU  Anesthesia Type:GA combined with regional for post-op pain  Level of Consciousness: awake, alert  and oriented  Airway & Oxygen Therapy: Patient Spontanous Breathing and Patient connected to nasal cannula oxygen  Post-op Assessment: Report given to RN, Post -op Vital signs reviewed and stable and Patient moving all extremities X 4  Post vital signs: Reviewed and stable  Last Vitals:  Filed Vitals:   02/19/15 1120 02/19/15 1515  BP: 119/53 131/65  Pulse: 76 90  Temp:  36.9 C  Resp: 19 20    Complications: No apparent anesthesia complications

## 2015-02-19 NOTE — H&P (Signed)
Kristin Adams Location: [facility] Patient #: [id number] DOB: 12/11/1955 Married / Language: English / Race: White Female   History of Present Illness  Patient words: New-breast.  The patient is a 59 year old female who presents with breast cancer. We are asked to see the patient in consultation by Dr. Ammie Ferrier to evaluate her for right breast DCIS. The patient is a 59 year old white female who recently went for a routine screening mammogram. At that time she was found to have some abnormal calcifications in the upper outer quadrant of the right breast covering about 6 cm. The calcifications came very close to the nipple. The calcifications were biopsied and came back as low-grade DCIS. She was ER and PR positive. She does have a history of stage III left breast cancer that was treated with lumpectomy and axillary lymph node dissection with radiation, chemotherapy, and tamoxifen. This occurred about 11 years ago. She denies any breast pain or discharge from the nipple   Other Problems  Anxiety Disorder Arthritis Back Pain Breast Cancer Oophorectomy  Past Surgical History  Breast Biopsy Bilateral. Breast Mass; Local Excision Bilateral. Cesarean Section - 1 Foot Surgery Bilateral. Hysterectomy (not due to cancer) - Complete  Diagnostic Studies History Colonoscopy 1-5 years ago Mammogram within last year Pap Smear 1-5 years ago  Allergies  Corticosteroids  Medication History Escitalopram Oxalate (20MG  Tablet, Oral) Active. Metoprolol Succinate ER (50MG  Tablet ER 24HR, Oral) Active. ALPRAZolam (0.5MG  Tablet, Oral) Active. B Complex (Oral) Active. Calcium-Vitamin D (250MG  Capsule, Oral) Active. Multivitamin Adult (Oral) Active. Fish Oil Extra Strength (1200MG  Capsule, Oral) Active. Zinc (100MG  Tablet, Oral) Active. Medications Reconciled  Social History  Alcohol use Occasional alcohol use. Caffeine use Coffee. No drug  use Tobacco use Current every day smoker.  Family History  Cerebrovascular Accident Father. Colon Cancer Mother. Diabetes Mellitus Brother, Sister. Heart Disease Father. Kidney Disease Mother.  Pregnancy / Birth History  Age at menarche 39 years. Age of menopause 87-50 Gravida 1 Maternal age 91-35 Para 1    Review of Systems  General Present- Fatigue. Not Present- Appetite Loss, Chills, Fever, Night Sweats, Weight Gain and Weight Loss. Skin Present- Rash. Not Present- Change in Wart/Mole, Dryness, Hives, Jaundice, New Lesions, Non-Healing Wounds and Ulcer. HEENT Present- Wears glasses/contact lenses. Not Present- Earache, Hearing Loss, Hoarseness, Nose Bleed, Oral Ulcers, Ringing in the Ears, Seasonal Allergies, Sinus Pain, Sore Throat, Visual Disturbances and Yellow Eyes. Respiratory Not Present- Cough, Dyspnea and Wheezing. Breast Not Present- Nipple Discharge. Cardiovascular Not Present- Chest Pain, Hypertension, Murmur and Palpitations. Gastrointestinal Not Present- Abdominal Pain, Abdominal Swelling, Constipation and Diarrhea. Musculoskeletal Not Present- Back Pain and Joint Pain. Psychiatric Not Present- Anxiety, Delirium and Depression. Hematology Not Present- Abnormal Bleeding, Easy Bruising and Enlarged Lymph Nodes.  Vitals  Weight: 153 lb Height: 64.5in Body Surface Area: 1.76 m Body Mass Index: 25.86 kg/m  Temp.: 98.18F(Oral)  Pulse: 76 (Regular)  BP: 112/80 (Sitting, Left Arm, Standard)       Physical Exam  General Mental Status-Alert. General Appearance-Consistent with stated age. Hydration-Well hydrated. Voice-Normal.  Head and Neck Head-normocephalic, atraumatic with no lesions or palpable masses. Trachea-midline. Thyroid Gland Characteristics - normal size and consistency.  Eye Eyeball - Bilateral-Extraocular movements intact. Sclera/Conjunctiva - Bilateral-No scleral icterus.  Chest and Lung  Exam Chest and lung exam reveals -quiet, even and easy respiratory effort with no use of accessory muscles and on auscultation, normal breath sounds, no adventitious sounds and normal vocal resonance. Inspection Chest Wall - Normal. Back -  normal.  Cardiovascular Cardiovascular examination reveals -normal heart sounds, regular rate and rhythm with no murmurs and normal pedal pulses bilaterally.  Abdomen Inspection Inspection of the abdomen reveals - No Hernias. Skin - Scar - no surgical scars. Palpation/Percussion Palpation and Percussion of the abdomen reveal - Soft, Non Tender, No Rebound tenderness, No Rigidity (guarding) and No hepatosplenomegaly. Auscultation Auscultation of the abdomen reveals - Bowel sounds normal.  Neurologic Neurologic evaluation reveals -alert and oriented x 3 with no impairment of recent or remote memory. Mental Status-Normal.  Musculoskeletal Normal Exam - Left-Upper Extremity Strength Normal and Lower Extremity Strength Normal. Normal Exam - Right-Upper Extremity Strength Normal and Lower Extremity Strength Normal.  Lymphatic Head & Neck  General Head & Neck Lymphatics: Bilateral - Description - Normal. Axillary  General Axillary Region: Bilateral - Description - Normal. Tenderness - Non Tender. Femoral & Inguinal  Generalized Femoral & Inguinal Lymphatics: Bilateral - Description - Normal. Tenderness - Non Tender.    Assessment & Plan  DCIS (DUCTAL CARCINOMA IN SITU) OF BREAST, RIGHT (D05.11) Impression: The patient appears to have a 6 cm area of DCIS in the upper outer right breast. She also has a history of stage III left sided breast cancer. Because of the size of the area involved with DCIS I think her best option for treatment will be mastectomy. She is favoring bilateral mastectomies. At this point I will refer her to plastic surgery to talk about the options for reconstruction. Once she has decided whether or not to have  reconstruction she will let us know and we will move forward with scheduling. I have discussed with her in detail the risks and benefits of the operation to do bilateral mastectomies as well as some of the technical aspects and she understands and wishes to proceed. She will also need a right-sided sentinel node mapping. Current Plans Referred to Surgery - Plastic, for evaluation and follow up (Plastic Surgery). Routine. Pt Education - Breast Cancer: discussed with patient and provided information.

## 2015-02-19 NOTE — Op Note (Signed)
02/19/2015  1:54 PM  PATIENT:  Kristin Adams  59 y.o. female  PRE-OPERATIVE DIAGNOSIS:  right dcis, prophylactic left breast   POST-OPERATIVE DIAGNOSIS:  right dcis, prophylactic left breast   PROCEDURE:  Procedure(s): RIGHT MASTECTOMY WITH SENTINEL LYMPH NODE BIOPSY (Right) LEFT MASTECTOMY PROPHYLACTIC (Left) IMMEDIATE BILATERAL BREAST RECONSTRUCTION WITH PLACEMENT OF TISSUE EXPANDER AND FLEX HD (ACELLULAR HYDRATED DERMIS) (Bilateral)  SURGEON:  Surgeon(s) and Role: Panel 1:    * Jovita Kussmaul, MD - Primary  Panel 2:    * Loel Lofty Dillingham, DO - Primary  PHYSICIAN ASSISTANT:   ASSISTANTS: Sharyn Dross, RNFA   ANESTHESIA:   general  EBL:  Total I/O In: 1250 [I.V.:1000; IV Piggyback:250] Out: 77 [Urine:40]  BLOOD ADMINISTERED:none  DRAINS: none   LOCAL MEDICATIONS USED:  NONE  SPECIMEN:  Source of Specimen:  right mastectomy and sentinel nodes X 2, left mastectomy  DISPOSITION OF SPECIMEN:  PATHOLOGY  COUNTS:  YES  TOURNIQUET:  * No tourniquets in log *  DICTATION: .Dragon Dictation   After informed consent was obtained the patient was brought to the operating room and placed in the supine position on the operating room table. After adequate induction of general anesthesia the patient's bilateral chest, breast, and axillary areas were prepped with ChloraPrep, allowed to dry, and draped in usual sterile manner. Attention was first turned to the left breast. An elliptical incision was made around the nipple and areola complex in order to spare the skin. The incision was carried through the skin and subcutaneous tissue sharply with the plasma blade. Breast hooks were used to elevate the skin flaps anteriorly towards the ceiling. Thin skin flaps were created circumferentially around the incision between the breast tissue in the subcutaneous fat. This dissection was carried circumferentially all the way to the chest wall. Next the breast was removed from the pectoralis  muscle with the pectoralis fascia in a top-down technique. This was also done sharply with the plasma blade. Once the breast was removed it was oriented with a stitch on the lateral skin and sent to pathology for further evaluation. Hemostasis was achieved using the plasma blade. The wound was irrigated with copious amounts of saline and packed with a moistened lap sponge. This wound was then covered with a sterile blue towel. Attention was then turned to the right breast. A similar elliptical incision was made around the nipple and areola complex in order to spare the skin. The incision was carried through the skin and subcutaneous tissue sharply with the plasma blade. Breast hooks were used to elevate the skin flaps anteriorly towards the ceiling. Thin skin flaps were then created circumferentially around the incision between the breast tissue in the subcutaneous fat. This dissection was carried circumferentially all the way to the chest wall. Laterally once the axilla was reached the neoprobe was used to identify a hot spot in the right axilla. The neoprobe was used to direct a blunt hemostat dissection in this area and I was able to identify what appeared to be 2 different lymph nodes. These were excised sharply with the plasma blade and the lymphatics and some small vessels were controlled with clips. Ex vivo counts on sentinel node #1 were approximately 250. Sentinel node #2 was palpable but not hot. These were sent to pathology for further evaluation. No other hot or palpable lymph nodes were identified in the right axilla. Next the breast was removed from the pectoralis muscle with the pectoralis fascia in a top-down technique. This  was also done sharply with the plasma blade. Once the breast was removed it was oriented with a stitch on the lateral skin and sent to pathology for further evaluation. Hemostasis was achieved using the plasma blade. The wound was irrigated with copious amounts of saline and  packed with a moistened lap sponge. At this point the operation was turned over to Dr. Marla Roe for the reconstruction. Her portion of the operation will be dictated separately. The patient was in stable condition and was tolerating the surgery well. At this point all needle sponge and instrument counts were correct.  PLAN OF CARE: Admit for overnight observation  PATIENT DISPOSITION:  PACU - hemodynamically stable.   Delay start of Pharmacological VTE agent (>24hrs) due to surgical blood loss or risk of bleeding: no

## 2015-02-19 NOTE — Brief Op Note (Signed)
02/19/2015  3:06 PM  PATIENT:  Kristin Adams  59 y.o. female  PRE-OPERATIVE DIAGNOSIS:  right dcis prophylactic left breast   POST-OPERATIVE DIAGNOSIS:  right dcis prophylactic left breast   PROCEDURE:  Procedure(s): RIGHT MASTECTOMY WITH SENTINEL LYMPH NODE BIOPSY (Right) LEFT MASTECTOMY PROPHYLACTIC (Left) IMMEDIATE BILATERAL BREAST RECONSTRUCTION WITH PLACEMENT OF TISSUE EXPANDER AND FLEX HD (ACELLULAR HYDRATED DERMIS) (Bilateral)  SURGEON:  Surgeon(s) and Role: Panel 1:    * Jovita Kussmaul, MD - Primary  Panel 2:    * Loel Lofty Dillingham, DO - Primary  PHYSICIAN ASSISTANT: Shawn Rayburn, PA  ASSISTANTS: none   ANESTHESIA:   general  EBL:  Total I/O In: 1250 [I.V.:1000; IV Piggyback:250] Out: 180 [Urine:80; Blood:100]  BLOOD ADMINISTERED:none  DRAINS: (1) Jackson-Pratt drain(s) with closed bulb suction in the breast pocket on each.   LOCAL MEDICATIONS USED:  NONE  SPECIMEN:  No Specimen  DISPOSITION OF SPECIMEN:  N/A  COUNTS:  YES  TOURNIQUET:  * No tourniquets in log *  DICTATION: .Dragon Dictation  PLAN OF CARE: Admit for overnight observation  PATIENT DISPOSITION:  PACU - hemodynamically stable.   Delay start of Pharmacological VTE agent (>24hrs) due to surgical blood loss or risk of bleeding: no

## 2015-02-19 NOTE — Anesthesia Procedure Notes (Addendum)
Anesthesia Regional Block:  Pectoralis block  Pre-Anesthetic Checklist: ,, timeout performed, Correct Patient, Correct Site, Correct Laterality, Correct Procedure, Correct Position, site marked, Risks and benefits discussed,  Surgical consent,  Pre-op evaluation,  At surgeon's request and post-op pain management  Laterality: Left and Right  Prep: chloraprep       Needles:  Injection technique: Single-shot  Needle Type: Echogenic Needle     Needle Length: 9cm 9 cm Needle Gauge: 21 and 21 G    Additional Needles:  Procedures: ultrasound guided (picture in chart) Pectoralis block Narrative:  Start time: 02/19/2015 10:54 AM End time: 02/19/2015 11:08 AM Injection made incrementally with aspirations every 5 mL.  Performed by: Personally  Anesthesiologist: HODIERNE, ADAM  Additional Notes: Bilateral PECS blocks performed.  20 mL of 0.5% Bupivacaine +epi injected at each site.  Total 40 mL given.  Pt tolerated the procedure well.   Procedure Name: Intubation Date/Time: 02/19/2015 12:01 PM Performed by: Neldon Newport Pre-anesthesia Checklist: Patient being monitored, Suction available, Emergency Drugs available, Patient identified and Timeout performed Patient Re-evaluated:Patient Re-evaluated prior to inductionOxygen Delivery Method: Circle system utilized Preoxygenation: Pre-oxygenation with 100% oxygen Intubation Type: IV induction Ventilation: Mask ventilation without difficulty Laryngoscope Size: Mac and 3 Grade View: Grade II Tube type: Oral Tube size: 7.0 mm Number of attempts: 2 Placement Confirmation: positive ETCO2,  ETT inserted through vocal cords under direct vision and breath sounds checked- equal and bilateral Secured at: 22 cm Tube secured with: Tape Dental Injury: Teeth and Oropharynx as per pre-operative assessment

## 2015-02-19 NOTE — Op Note (Signed)
Op report    DATE OF OPERATION:  02/19/2015  LOCATION: [facility]  SURGICAL DIVISION: Plastic Surgery  PREOPERATIVE DIAGNOSES:  1. Right Breast cancer.    POSTOPERATIVE DIAGNOSES:  1. Right Breast cancer.   PROCEDURE:  1. Bilateral immediate breast reconstruction with placement of Acellular Dermal Matrix and tissue expanders.  SURGEON: Theodoro Kos, DO  ASSISTANT: Shawn Rayburn, PA  ANESTHESIA:  General.   COMPLICATIONS: None.   IMPLANTS: Left - Mentor 100cc. Ref [id number].  Serial Number [id number] Right - Mentor 100cc. Ref [id number].  Serial Number [id number] Acellular Dermal Matrix 4 x 14 cm two  INDICATIONS FOR PROCEDURE:  The patient, Kristin Adams, is a 59 y.o. female born on [birth year], is here for  immediate first stage breast reconstruction with placement of bialteral tissue expander and Acellular dermal matrix.  She has right breast cancer and was treated for left breast cancer in the past. MRN: [id number]  CONSENT:  Informed consent was obtained directly from the patient. Risks, benefits and alternatives were fully discussed. Specific risks including but not limited to bleeding, infection, hematoma, seroma, scarring, pain, implant infection, implant extrusion, capsular contracture, asymmetry, wound healing problems, and need for further surgery were all discussed. The patient did have an ample opportunity to have her questions answered to her satisfaction.   DESCRIPTION OF PROCEDURE:  The patient was taken to the operating room by the general surgery team. SCDs were placed and IV antibiotics were given. The patient's chest was prepped and draped in a sterile fashion. A time out was performed and the implants to be used were identified.  Bilateral mastectomies were performed.  Once the general surgery team had completed their portion of the case the patient was rendered to the plastic and reconstructive surgery team.  Left: The pectoralis major muscle  was lifted from the chest wall with release of the lateral edge and lateral inframammary fold.  The pocket was irrigated with antibiotic solution and hemostasis was achieved with electrocautery.  The ADM was then prepared according to the manufacture guidelines and slits placed to help with postoperative fluid management.  The ADM was then sutured to the inferior and lateral edge of the inframammary fold with 2-0 PDS starting with an interrupted stitch and then a running stitch.  The lateral portion was sutured to with interrupted sutures after the expander was placed.  The expander was prepared according to the manufacture guidelines, the air evacuated and then it was placed under the ADM and pectoralis major muscle.  The inferior and lateral tabs were used to secure the expander to the chest wall with 2-0 PDS.  The drain was placed at the inframammary fold over the ADM and secured to the skin with 3-0 Silk.    Right: The pectoralis major muscle was lifted from the chest wall with release of the lateral edge and lateral inframammary fold.  The pocket was irrigated with antibiotic solution and hemostasis was achieved with electrocautery.  The ADM was then prepared according to the manufacture guidelines and slits placed to help with postoperative fluid management.  The ADM was then sutured to the inferior and lateral edge of the inframammary fold with 2-0 PDS starting with an interrupted stitch and then a running stitch.  The lateral portion was sutured to with interrupted sutures after the expander was placed.  The expander was prepared according to the manufacture guidelines, the air evacuated and then it was placed under the ADM and pectoralis major muscle.  The inferior  and lateral tabs were used to secure the expander to the chest wall with 2-0 PDS.  The drain was placed at the inframammary fold over the ADM and secured to the skin with 3-0 Silk.   The deep layers were closed with 3-0 Vicryl followed by 4-0  Monocryl.  The skin was closed with 5-0 Monocryl and then dermabond was applied.  The ABDs and breast binder were placed.  The patient tolerated the procedure well and there were no complications.  The patient was allowed to wake from anesthesia and taken to the recovery room in satisfactory condition.

## 2015-02-19 NOTE — Interval H&P Note (Signed)
History and Physical Interval Note:  02/19/2015 10:49 AM  Kristin Adams  has presented today for surgery, with the diagnosis of right dcis prophylactic left breast   The various methods of treatment have been discussed with the patient and family. After consideration of risks, benefits and other options for treatment, the patient has consented to  Procedure(s): RIGHT MASTECTOMY WITH SENTINEL LYMPH NODE BIOPSY (Right) LEFT MASTECTOMY PROPHYLACTIC (Left) IMMEDIATE BILATERAL BREAST RECONSTRUCTION WITH PLACEMENT OF TISSUE EXPANDER AND FLEX HD (ACELLULAR HYDRATED DERMIS) (Bilateral) as a surgical intervention .  The patient's history has been reviewed, patient examined, no change in status, stable for surgery.  I have reviewed the patient's chart and labs.  Questions were answered to the patient's satisfaction.     TOTH III,Jonanthan Bolender S

## 2015-02-20 ENCOUNTER — Encounter (HOSPITAL_COMMUNITY): Payer: Self-pay | Admitting: General Surgery

## 2015-02-20 DIAGNOSIS — D0511 Intraductal carcinoma in situ of right breast: Secondary | ICD-10-CM | POA: Diagnosis not present

## 2015-02-20 NOTE — Progress Notes (Signed)
1 Day Post-Op  Subjective: Complains of pain  Objective: Vital signs in last 24 hours: Temp:  [97 F (36.1 C)-99.3 F (37.4 C)] 99.3 F (37.4 C) (12/16 0544) Pulse Rate:  [62-90] 64 (12/16 0544) Resp:  [13-25] 18 (12/16 0544) BP: (109-147)/(42-71) 140/58 mmHg (12/16 0544) SpO2:  [94 %-100 %] 94 % (12/16 0544) Weight:  [68.04 kg (150 lb)] 68.04 kg (150 lb) (12/15 0954) Last BM Date: 02/18/15  Intake/Output from previous day: 12/15 0701 - 12/16 0700 In: 4250 [P.O.:600; I.V.:3400; IV Piggyback:250] Out: 3015 [Urine:2480; Drains:435; Blood:100] Intake/Output this shift:    Resp: clear to auscultation bilaterally Cardio: regular rate and rhythm GI: soft, non-tender; bowel sounds normal; no masses,  no organomegaly  Lab Results:  No results for input(s): WBC, HGB, HCT, PLT in the last 72 hours. BMET No results for input(s): NA, K, CL, CO2, GLUCOSE, BUN, CREATININE, CALCIUM in the last 72 hours. PT/INR No results for input(s): LABPROT, INR in the last 72 hours. ABG No results for input(s): PHART, HCO3 in the last 72 hours.  Invalid input(s): PCO2, PO2  Studies/Results: Nm Sentinel Node Inj-no Rpt (breast)  02/19/2015  CLINICAL DATA: right breast dcis Sulfur colloid was injected intradermally by the nuclear medicine technologist for breast cancer sentinel node localization.    Anti-infectives: Anti-infectives    Start     Dose/Rate Route Frequency Ordered Stop   02/19/15 2000  ceFAZolin (ANCEF) IVPB 2 g/50 mL premix     2 g 100 mL/hr over 30 Minutes Intravenous Every 8 hours 02/19/15 1737     02/19/15 1414  polymyxin B 500,000 Units, bacitracin 50,000 Units in sodium chloride irrigation 0.9 % 500 mL irrigation  Status:  Discontinued       As needed 02/19/15 1415 02/19/15 1511   02/19/15 1330  ceFAZolin (ANCEF) IVPB 2 g/50 mL premix     2 g 100 mL/hr over 30 Minutes Intravenous To [facility] 02/18/15 1317 02/19/15 1204   02/19/15 1100  ceFAZolin (ANCEF) IVPB 2  g/50 mL premix  Status:  Discontinued     2 g 100 mL/hr over 30 Minutes Intravenous To ShortStay Surgical 02/18/15 1317 02/19/15 1646      Assessment/Plan: s/p Procedure(s): RIGHT MASTECTOMY WITH SENTINEL LYMPH NODE BIOPSY (Right) LEFT MASTECTOMY PROPHYLACTIC (Left) IMMEDIATE BILATERAL BREAST RECONSTRUCTION WITH PLACEMENT OF TISSUE EXPANDER AND FLEX HD (ACELLULAR HYDRATED DERMIS) (Bilateral) Advance diet as she can tolerate She may not be ready from a pain standpoint to go today. Will monitor     TOTH III,PAUL S 02/20/2015

## 2015-02-20 NOTE — Progress Notes (Signed)
Kristin Adams to be D/C'd home per MD order. Discussed with the patient and all questions fully answered.  VSS, Surgical incision site clean, dry, intact with dressing in place.  IV catheter discontinued intact. Site without signs and symptoms of complications. Dressing and pressure applied.  An After Visit Summary was printed and given to the patient. Drain management teaching was completed with patient and husband. They were able to verbalize and demonstrate understanding.  D/c education completed with patient/family including follow up instructions, medication list, d/c activities limitations if indicated, with other d/c instructions as indicated by MD - patient able to verbalize understanding, all questions fully answered.   Patient instructed to return to ED, call 911, or call MD for any changes in condition.   Patient to be escorted via [place], and D/C home via private auto.

## 2015-03-11 ENCOUNTER — Encounter (HOSPITAL_COMMUNITY): Payer: Self-pay

## 2015-03-11 ENCOUNTER — Telehealth: Payer: Self-pay | Admitting: Oncology

## 2015-03-11 ENCOUNTER — Ambulatory Visit (HOSPITAL_BASED_OUTPATIENT_CLINIC_OR_DEPARTMENT_OTHER): Payer: 59 | Admitting: Oncology

## 2015-03-11 VITALS — BP 106/43 | HR 74 | Temp 97.7°F | Resp 18 | Ht 65.0 in | Wt 147.5 lb

## 2015-03-11 DIAGNOSIS — Z853 Personal history of malignant neoplasm of breast: Secondary | ICD-10-CM | POA: Diagnosis not present

## 2015-03-11 DIAGNOSIS — D0511 Intraductal carcinoma in situ of right breast: Secondary | ICD-10-CM

## 2015-03-11 DIAGNOSIS — Z170 Estrogen receptor positive status [ER+]: Secondary | ICD-10-CM

## 2015-03-11 DIAGNOSIS — C50411 Malignant neoplasm of upper-outer quadrant of right female breast: Secondary | ICD-10-CM

## 2015-03-11 DIAGNOSIS — C50412 Malignant neoplasm of upper-outer quadrant of left female breast: Secondary | ICD-10-CM | POA: Insufficient documentation

## 2015-03-11 NOTE — Telephone Encounter (Signed)
Appointments made and avs printed for patient °

## 2015-03-11 NOTE — Addendum Note (Signed)
Addended by: Laureen Abrahams on: 03/11/2015 04:56 PM   Modules accepted: Medications

## 2015-03-11 NOTE — Progress Notes (Signed)
[place]  [phone]) [phone] Fax:(336) [phone]     ID: Kristin Adams DOB: [birth year]  MR#: [id number]  MCN#:[id number]  Patient Care Team: Mayra Neer, MD as PCP - General (Family Medicine) Chauncey Cruel, MD (Hematology and Oncology) Gery Pray, MD (Radiation Oncology) Autumn Messing III, MD as Consulting Physician (General Surgery) Wallace Going, DO as Attending Physician (Plastic Surgery) PCP: Mayra Neer, MD OTHER MD:  CHIEF COMPLAINT: Ductal carcinoma in situ  CURRENT TREATMENT: observation   BREAST CANCER HISTORY: From the 03/17/2004 intake note:  The patient had a screening mammogram at [facility] on 01-22-04.  The prior mammogram had been in August 2002.  This new screening mammogram showed a possible left breast mass, and the patient was brought back for additional studies on 02-10-04.  Spot compression views confirmed the presence of a spiculated mass in the 11:00 position of the left breast which could be palpated once it had been located by mammography.  Ultrasound showed a spiculated solid mass measuring up to 1.3 cm. The axilla on the left was unremarkable.  Accordingly, the patient proceeded to biopsy under ultrasound guidance on the same day, 02-10-04, and this showed ([id number]) an invasive mammary carcinoma which was ER positive at 90%, PR positive at 94%, and HER-2/neu negative by FISH.  With this information, the patient was referred to Dr. Annamaria Boots and, after appropriate discussion, she underwent left lumpectomy with sentinel lymph node biopsy on 02-26-04.  The final pathology there ([id number]) showed one of the two sentinel lymph nodes to be positive for metastatic disease (greater than 2 mm.).  The mass itself measured 1.6 cm., it was Grade 2, margins were adequate, the patient accordingly staging as T1, N1.  With this information, she is referred here for further evaluation and treatment.    Her subsequent history as far as her  left breast cancer is concerned is as detailed below.  More recently however Kristin Adams had bilateral screening [facility]. This showed a suspicious area of calcification in the right breast which was further evaluated with right diagnostic mammography on 12/15/2014. The breast density was category B. In the upper outer quadrant of the right breast there were amorphus calcifications spanning approximately 6 cm in a segmental distribution. Biopsy of this area 12/16/2014 showed ([id number]) ductal carcinoma in situ, low-grade, estrogen receptor 90% positive, progesterone receptor 20% positive. A second area also showed DCIS and it was again estrogen receptor 100% positive and progesterone receptor 10% positive  Her subsequent history as detailed below  INTERVAL HISTORY: Kristin Adams returns today for follow-up of her noninvasive breast cancer accompanied by her husband Rush Landmark. Since her last visit here she underwent bilateral mastectomies, on 02/19/2015. On the left there were only fibrocystic changes. On the right there was ductal carcinoma in situ spanning 2.0 cm. Margins were ample. All 4 sentinel lymph nodes were clear as was a non-sentinel lymph node (total 5 removed--accession number [id number])). She had bilateral immediate reconstruction with tissue expanders.  REVIEW OF SYSTEMS: Kristin Adams did well with her surgery, without unusual pain, fever, or bleeding. However she is very dissatisfied with the reconstruction so far. The breasts are not symmetrical and of course they are very uneven still since she is at an early stage in inflation of the expanders. Also she complained about having to call the plastic surgery office multiple times with multiple confusion regarding exactly what kind of surgery she was going to receive. She is taking tramadol  for pain and that works fairly well. She's also massaging the breast area as suggested by her plastic surgeon. Aside from  these issues a detailed review of systems today was noncontributory  PAST MEDICAL HISTORY: Past Medical History  Diagnosis Date  . IBS (irritable bowel syndrome)   . Headaches, cluster   . Wears glasses   . Cancer [facility])     Breast Cancer  . Arthritis   . Mitral valve prolapse   . Complication of anesthesia   . PONV (postoperative nausea and vomiting)   . Hypertension   . Anxiety     PAST SURGICAL HISTORY: Past Surgical History  Procedure Laterality Date  . Vaginal hysterectomy  2000    3 LAP PROC FOR ENDOMET  . Breast lumpectomy  2005  . Mastectomy w/ sentinel node biopsy Right 02/19/2015    Procedure: RIGHT MASTECTOMY WITH SENTINEL LYMPH NODE BIOPSY;  Surgeon: Autumn Messing III, MD;  Location: [facility];  Service: General;  Laterality: Right;  . Mastectomy, partial Left 02/19/2015    Procedure: LEFT MASTECTOMY PROPHYLACTIC;  Surgeon: Autumn Messing III, MD;  Location: [facility];  Service: General;  Laterality: Left;  . Breast reconstruction with placement of tissue expander and flex hd (acellular hydrated dermis) Bilateral 02/19/2015    Procedure: IMMEDIATE BILATERAL BREAST RECONSTRUCTION WITH PLACEMENT OF TISSUE EXPANDER AND FLEX HD (ACELLULAR HYDRATED DERMIS);  Surgeon: Loel Lofty Dillingham, DO;  Location: [facility];  Service: Plastics;  Laterality: Bilateral;    FAMILY HISTORY Family History  Problem Relation Age of Onset  . Heart disease Father   . Cancer Mother 75    colon  . Diabetes Brother   . Heart disease Brother   . Diabetes Sister   . Breast cancer Sister 74  . Kidney disease Sister   . Melanoma Maternal Aunt   . Lung cancer Maternal Aunt   The patient's father died at the age of 27 from congestive heart failure.  The patient's mother died at age 64. She had colon cancer..  The patient has two brothers and two sisters. One sister was diagnosed with breast cancer at age 57.  There is no other history of ovarian or breast cancer in the family.    GYNECOLOGIC HISTORY:  No LMP  recorded. Patient has had a hysterectomy. Menarche age 8, first live birth age 67, which increases the risk of breast cancer. She is G1, P1. She underwent hysterectomy with bilateral salpingo-oophorectomy in 2000. She used hormone replacement approximately 10 years  SOCIAL HISTORY:  She works in Animal nutritionist for [employer].  Her husband, Rush Landmark, who is present today, worked as a Dealer with the [employer] but is now retired.  Their daughter, Ace Gins lives in [place] and is a stay-at-home mom.  ADVANCED DIRECTIVES: In place   HEALTH MAINTENANCE: Social History  Substance Use Topics  . Smoking status: Current Every Day Smoker -- 0.50 packs/day for 42 years    Types: Cigarettes  . Smokeless tobacco: Not on file  . Alcohol Use: 1.5 oz/week    3 Standard drinks or equivalent per week     Comment: WINE      Colonoscopy: 2013/[facility]  PAP: Status post hysterectomy  Bone density: 2015/osteopenia  Lipid panel:  Allergies  Allergen Reactions  . Adhesive [Tape] Rash    blisters  . Corticosteroids Hives, Shortness Of Breath, Swelling and Rash  . Dexamethasone Nausea And Vomiting  . Eggs Or Egg-Derived Products Nausea And Vomiting  . Lactase  Nausea Only  . Prednisone Nausea And Vomiting  . Fludrocortisone Rash  . Peanut-Containing Drug Products Rash    "suspected allergy"  . Shellfish Allergy Rash    "suspected list"    Current Outpatient Prescriptions  Medication Sig Dispense Refill  . ALPRAZolam (XANAX) 0.5 MG tablet Take 0.5 mg by mouth at bedtime as needed for anxiety.     . B Complex-C (SUPER B COMPLEX PO) Take 1 tablet by mouth daily.     . Calcium Carbonate-Vitamin D (CALCIUM + D PO) Take 1,200 mg by mouth daily.      . Cholecalciferol (VITAMIN D) 1000 UNITS capsule Take 1,000 Units by mouth daily.      . diphenhydrAMINE (BENADRYL) 25 mg capsule Take 25 mg by mouth every 8 (eight) hours as needed for itching or allergies.  30 capsule 0  .  escitalopram (LEXAPRO) 20 MG tablet Take 20 mg by mouth daily.      . halobetasol (ULTRAVATE) 0.05 % cream Apply 1 application topically daily.  3  . HYDROcodone-acetaminophen (NORCO/VICODIN) 5-325 MG tablet Take 1 tablet by mouth daily as needed for moderate pain.   0  . metoprolol (TOPROL-XL) 50 MG 24 hr tablet Take 25 mg by mouth daily.    . Multiple Vitamin (MULTIVITAMIN PO) Take 1 tablet by mouth daily.     . ondansetron (ZOFRAN) 4 MG tablet Take 4 mg by mouth every 8 (eight) hours as needed for nausea.   0  . vitamin C (ASCORBIC ACID) 500 MG tablet Take 500 mg by mouth daily as needed (cold symptoms).     . Zinc Sulfate (ZINC 15 PO) Take 15 mg by mouth as needed (cold symptoms).      No current facility-administered medications for this visit.    OBJECTIVE: Middle-aged white Adams in no acute distress Filed Vitals:   03/11/15 1056  BP: 106/43  Pulse: 74  Temp: 97.7 F (36.5 C)  Resp: 18     Body mass index is 24.55 kg/(m^2).    ECOG FS:1 - Symptomatic but completely ambulatory  Sclerae unicteric, pupils round and equal Oropharynx clear and moist-- no thrush or other lesions No cervical or supraclavicular adenopathy Lungs no rales or rhonchi Heart regular rate and rhythm Abd soft, nontender, positive bowel sounds MSK no focal spinal tenderness, no upper extremity lymphedema Neuro: nonfocal, well oriented, appropriate affect Breasts: status post bilateral mastectomies with bilateral expanders in place. There is no evidence of dehiscence, erythema, or swelling. At this point the expanders are asymmetrical, with the left one being a bit higher than the right. There is significant wrinkling, which is not unexpected at this point. Both axillae are benign   LAB RESULTS:  CMP     Component Value Date/Time   NA 136 02/11/2015 1548   NA 139 12/31/2014 1030   K 4.1 02/11/2015 1548   K 4.4 12/31/2014 1030   CL 103 02/11/2015 1548   CL 106 04/16/2012 1410   CO2 27 02/11/2015 1548    CO2 29 12/31/2014 1030   GLUCOSE 92 02/11/2015 1548   GLUCOSE 84 12/31/2014 1030   GLUCOSE 80 04/16/2012 1410   BUN 13 02/11/2015 1548   BUN 15.1 12/31/2014 1030   CREATININE 1.16* 02/11/2015 1548   CREATININE 0.9 12/31/2014 1030   CALCIUM 9.3 02/11/2015 1548   CALCIUM 10.2 12/31/2014 1030   PROT 7.5 12/31/2014 1030   PROT 6.8 04/07/2011 1402   ALBUMIN 4.5 12/31/2014 1030   ALBUMIN 4.7 04/07/2011 1402  AST 23 12/31/2014 1030   AST 22 04/07/2011 1402   ALT 16 12/31/2014 1030   ALT 14 04/07/2011 1402   ALKPHOS 72 12/31/2014 1030   ALKPHOS 72 04/07/2011 1402   BILITOT 0.61 12/31/2014 1030   BILITOT 0.6 04/07/2011 1402   GFRNONAA 50* 02/11/2015 1548   GFRAA 59* 02/11/2015 1548    INo results found for: SPEP, UPEP  Lab Results  Component Value Date   WBC 8.5 02/11/2015   NEUTROABS 4.1 12/31/2014   HGB 13.9 02/11/2015   HCT 41.2 02/11/2015   MCV 97.6 02/11/2015   PLT 135* 02/11/2015      Chemistry      Component Value Date/Time   NA 136 02/11/2015 1548   NA 139 12/31/2014 1030   K 4.1 02/11/2015 1548   K 4.4 12/31/2014 1030   CL 103 02/11/2015 1548   CL 106 04/16/2012 1410   CO2 27 02/11/2015 1548   CO2 29 12/31/2014 1030   BUN 13 02/11/2015 1548   BUN 15.1 12/31/2014 1030   CREATININE 1.16* 02/11/2015 1548   CREATININE 0.9 12/31/2014 1030      Component Value Date/Time   CALCIUM 9.3 02/11/2015 1548   CALCIUM 10.2 12/31/2014 1030   ALKPHOS 72 12/31/2014 1030   ALKPHOS 72 04/07/2011 1402   AST 23 12/31/2014 1030   AST 22 04/07/2011 1402   ALT 16 12/31/2014 1030   ALT 14 04/07/2011 1402   BILITOT 0.61 12/31/2014 1030   BILITOT 0.6 04/07/2011 1402       Lab Results  Component Value Date   LABCA2 18 04/16/2012    No components found for: WLNLG921  No results for input(s): INR in the last 168 hours.  Urinalysis    Component Value Date/Time   LABSPEC 1.005 04/14/2010 1037   PHURINE 5.0 04/14/2010 1037   HGBUR Negative 04/14/2010 1037    BILIRUBINUR Negative 04/14/2010 1037   KETONESUR Negative 04/14/2010 1037   PROTEINUR Negative 04/14/2010 1037   NITRITE Negative 04/14/2010 1037   LEUKOCYTESUR Moderate 04/14/2010 1037    STUDIES: Nm Sentinel Node Inj-no Rpt (breast)  02/19/2015  CLINICAL DATA: right breast dcis Sulfur colloid was injected intradermally by the nuclear medicine technologist for breast cancer sentinel node localization.    ASSESSMENT: 60 y.o. Kristin Adams  (1) status post left lumpectomy and sentinel lymph node biopsy December of 2005 for a T1c N1, stage IIA invasive ductal carcinoma, grade 2, estrogen and progesterone receptor positive, HER2 negative,   (a) treated adjuvantly with doxorubicin /cyclophosphamide x 4 followed by paclitaxel x 6 completed May of 2006   (b) followed by radiation completed in August of 2006,  (c) followed by 5 years of tamoxifen completed in August of 2011.    (2) status post right breast upper outer quadrant biopsy 12/16/2014 for ductal carcinoma in situ, low-grade, estrogen and progesterone receptor positive, measuring approximately 6 cm  (3) genetics testing 02/04/2015 through the Breast/Ovarian cancer gene panel offered by [facility] no deleterious mutations in ATM, BARD1, BRCA1, BRCA2, BRIP1, CDH1, CHEK2, EPCAM, FANCC, MLH1, MSH2, MSH6, NBN, PALB2, PMS2, PTEN, RAD51C, RAD51D, TP53, and XRCC2.  (4) status post bilateral mastectomies 02/19/2015 showing  (a) on the left, no evidence of malignancy  (b) on the right, ductal carcinoma in situ, 2.0 cm, with negative margins and 0 of 5 lymph nodes involved  PLAN: Teresea underwent successful mastectomy for noninvasive breast cancer. Her margins are negative. The cure rate for this type of cancer with this procedure is close  to 100%. Accordingly she will not need any further local treatment such as radiation. She also will not need systemic treatment such as anti-estrogens, even for prophylaxis, since she presumably has no  breast tissue left.  She also will not need mammography, ultrasonography, or breast MRIs in the future except to evaluate any palpable abnormalities. She will need a yearly physician breast exam of course.  She is very dissatisfied with the difficulties she had arranging for her surgery and some misunderstandings that occurred with support staff. I urged her to let Dr. Marla Roe know about this so that she may try to get these problems taking care of. As for the reconstruction itself, right now she is dissatisfied but I think with a little bit more time, a little more filling, and possibly a little more on Last he she may become more reconciled to the way her new breasts will look.  I think she will benefit from participation in the finding your new normal group and I gave her the appropriate information.  I'm going to see her one more time in October. Since she has good follow-up through her other physicians, that may be our closing visit. However she may be interested in our survivorship program and I will make that available to her at that point as well.  She knows to call for any problems that may develop before the next visit here.  Chauncey Cruel, MD   03/11/2015 11:26 AM Medical Oncology and Hematology [facility] [address] [place], [place] [address] Tel. [phone]    Fax. [phone]

## 2015-03-12 ENCOUNTER — Other Ambulatory Visit: Payer: Self-pay | Admitting: *Deleted

## 2015-03-12 ENCOUNTER — Telehealth: Payer: Self-pay | Admitting: Nurse Practitioner

## 2015-03-12 DIAGNOSIS — C50411 Malignant neoplasm of upper-outer quadrant of right female breast: Secondary | ICD-10-CM

## 2015-03-12 NOTE — Telephone Encounter (Signed)
Spoke with patient re SCP visit 3/16.

## 2015-05-21 ENCOUNTER — Encounter: Payer: Self-pay | Admitting: Nurse Practitioner

## 2015-05-21 ENCOUNTER — Ambulatory Visit (HOSPITAL_BASED_OUTPATIENT_CLINIC_OR_DEPARTMENT_OTHER): Payer: 59 | Admitting: Nurse Practitioner

## 2015-05-21 VITALS — BP 134/89 | HR 75 | Temp 97.7°F | Resp 18 | Ht 65.0 in | Wt 153.3 lb

## 2015-05-21 DIAGNOSIS — M799 Soft tissue disorder, unspecified: Secondary | ICD-10-CM | POA: Diagnosis not present

## 2015-05-21 DIAGNOSIS — C50411 Malignant neoplasm of upper-outer quadrant of right female breast: Secondary | ICD-10-CM

## 2015-05-21 DIAGNOSIS — D0511 Intraductal carcinoma in situ of right breast: Secondary | ICD-10-CM

## 2015-05-21 DIAGNOSIS — Z170 Estrogen receptor positive status [ER+]: Secondary | ICD-10-CM | POA: Diagnosis not present

## 2015-05-21 NOTE — Progress Notes (Signed)
CLINIC:  Cancer Survivorship   REASON FOR VISIT:  Routine follow-up post-treatment for a recent history of breast cancer.  BRIEF ONCOLOGIC HISTORY:    Breast cancer, right ([place])   02/2004 Cancer Diagnosis Stage IIA (T1c N1) IDC, grade 2, ER/PR+, HER2/neu negative, S/P left lumpectomy/SLNB followed by adjuvant doxorubicin and cyclophosphamide x 6 followed by adjuvant RT and adjuvant tamoxifen x 5 years (completed 10/2009)   12/16/2014 Initial Biopsy Right breast UOQ bx: DCIS, low grade, ER/PR+, 6.0 cm   12/16/2014 Clinical Stage Stage 0: Tis N0   02/04/2015 Procedure Breast/Ovarian panel ([facility]): variant of unknown significance found at BRCA2 c.2803G>C VUS; otherwise negative at ATM, BARD1, BRCA1, BRCA2, BRIP1, CDH1, CHEK2, EPCAM, FANCC, MLH1, MSH2, MSH6, NBN, PALB2, PMS2, PTEN, RAD51C, RAD51D, TP53, and XRCC2.   02/19/2015 Definitive Surgery Bilateral mastectomies: LEFT: benign; RIGHT: DCIS, 2.0 cm, 0/5 LN, negative margins   02/19/2015 Pathologic Stage Stage 0: Tis No    INTERVAL HISTORY:  Kristin Adams presents to the [facility] today for our initial meeting to review her survivorship care plan detailing her treatment course for breast cancer, as well as monitoring long-term side effects of that treatment, education regarding health maintenance, screening, and overall wellness and health promotion.     Overall, Kristin Adams reports doing well since her surgery in December 2016.  She continues to follow with Dr. Iran Planas and is planning her implant exchange next month.  She has some tightness in her chest wall related to the expanders, which interferes with her sleep.  She has some ongoing fatigue.  She denies headache, cough, shortness of breath or bone pain.  She does report the presence of a small mass in her lower left back that has been there for "years," but she thinks may be increasing in size.  It is non-tender.  She has a good appetite and denies any weight loss.    REVIEW OF  SYSTEMS:  General: Fatigue as above. Denies fever, chills, unintentional weight loss, or night sweats. HEENT: Denies visual changes, hearing loss, mouth sores or difficulty swallowing. Cardiac: Denies palpitations, chest pain, and lower extremity edema.  Respiratory: Denies wheeze or dyspnea on exertion.  Breast: Chest tightness from implants, as above.  GI: Denies abdominal pain, constipation, diarrhea, nausea, or vomiting.  GU: Denies dysuria, hematuria, vaginal bleeding, vaginal discharge, or vaginal dryness.  Musculoskeletal: Small mass in lower left back as above.  Denies joint or bone pain.  Neuro: Denies recent fall or numbness / tingling in her extremities. Skin: Denies rash, pruritis, or open wounds.  Psych: Denies depression, anxiety, insomnia, or memory loss.   A 14-point review of systems was completed and was negative, except as noted above.   ONCOLOGY TREATMENT TEAM:  1. Surgeon:  Dr. Marlou Starks at [facility]  2. Medical Oncologist: Dr. Jana Hakim 3. Plastic Surgeon: Dr. Marla Roe    PAST MEDICAL/SURGICAL HISTORY:  Past Medical History  Diagnosis Date  . IBS (irritable bowel syndrome)   . Headaches, cluster   . Wears glasses   . Cancer [facility])     Breast Cancer  . Arthritis   . Mitral valve prolapse   . Complication of anesthesia   . PONV (postoperative nausea and vomiting)   . Hypertension   . Anxiety    Past Surgical History  Procedure Laterality Date  . Vaginal hysterectomy  2000    3 LAP PROC FOR ENDOMET  . Breast lumpectomy  2005  . Mastectomy w/ sentinel node biopsy Right 02/19/2015  Procedure: RIGHT MASTECTOMY WITH SENTINEL LYMPH NODE BIOPSY;  Surgeon: Autumn Messing III, MD;  Location: [place];  Service: General;  Laterality: Right;  . Mastectomy, partial Left 02/19/2015    Procedure: LEFT MASTECTOMY PROPHYLACTIC;  Surgeon: Autumn Messing III, MD;  Location: [place];  Service: General;  Laterality: Left;  . Breast reconstruction with placement of tissue  expander and flex hd (acellular hydrated dermis) Bilateral 02/19/2015    Procedure: IMMEDIATE BILATERAL BREAST RECONSTRUCTION WITH PLACEMENT OF TISSUE EXPANDER AND FLEX HD (ACELLULAR HYDRATED DERMIS);  Surgeon: Loel Lofty Dillingham, DO;  Location: [place];  Service: Plastics;  Laterality: Bilateral;     ALLERGIES:  Allergies  Allergen Reactions  . Adhesive [Tape] Rash    blisters  . Corticosteroids Hives, Shortness Of Breath, Swelling and Rash  . Dexamethasone Nausea And Vomiting  . Eggs Or Egg-Derived Products Nausea And Vomiting  . Lactase Nausea Only  . Prednisone Nausea And Vomiting  . Fludrocortisone Rash  . Peanut-Containing Drug Products Rash    "suspected allergy"  . Shellfish Allergy Rash    "suspected list"     CURRENT MEDICATIONS:  Current Outpatient Prescriptions on File Prior to Visit  Medication Sig Dispense Refill  . ALPRAZolam (XANAX) 0.5 MG tablet Take 0.5 mg by mouth at bedtime as needed for anxiety.     . B Complex-C (SUPER B COMPLEX PO) Take 1 tablet by mouth daily.     . Calcium Carbonate-Vitamin D (CALCIUM + D PO) Take 1,200 mg by mouth daily.      . Cholecalciferol (VITAMIN D) 1000 UNITS capsule Take 1,000 Units by mouth daily.      . diphenhydrAMINE (BENADRYL) 25 mg capsule Take 25 mg by mouth every 8 (eight) hours as needed for itching or allergies.  30 capsule 0  . escitalopram (LEXAPRO) 20 MG tablet Take 20 mg by mouth daily.      . halobetasol (ULTRAVATE) 0.05 % cream Apply 1 application topically daily.  3  . metoprolol (TOPROL-XL) 50 MG 24 hr tablet Take 25 mg by mouth daily.    . Multiple Vitamin (MULTIVITAMIN PO) Take 1 tablet by mouth daily.     . vitamin C (ASCORBIC ACID) 500 MG tablet Take 500 mg by mouth daily as needed (cold symptoms).     . Zinc Sulfate (ZINC 15 PO) Take 15 mg by mouth as needed (cold symptoms).      No current facility-administered medications on file prior to visit.     ONCOLOGIC FAMILY HISTORY:  Family History  Problem  Relation Age of Onset  . Heart disease Father   . Cancer Mother 80    colon  . Diabetes Brother   . Heart disease Brother   . Diabetes Sister   . Breast cancer Sister 27  . Kidney disease Sister   . Melanoma Maternal Aunt   . Lung cancer Maternal Aunt      GENETIC COUNSELING/TESTING: Yes, performed 02/04/2015: Breast/Ovarian panel ([facility]): variant of unknown significance found at BRCA2 c.2803G>C VUS; otherwise negative at ATM, BARD1, BRCA1, BRCA2, BRIP1, CDH1, CHEK2, EPCAM, FANCC, MLH1, MSH2, MSH6, NBN, PALB2, PMS2, PTEN, RAD51C, RAD51D, TP53, and XRCC2.   SOCIAL HISTORY:  Kristin Adams is married and lives with her family in [place], [place].  She has 1 child. Kristin Adams is currently working with the [employer].  She is a current smoker and drink approximately 3 glasses / wine week. She denies any current or history of illicit drug use.  PHYSICAL EXAMINATION:  Vital Signs: Filed Vitals:   05/21/15 1028  BP: 134/89  Pulse: 75  Temp: 97.7 F (36.5 C)  Resp: 18   ECOG Performance Status: 0  General: Well-nourished, well-appearing female in no acute distress.  She is unaccompanied in clinic today.   HEENT: Head is atraumatic and normocephalic.  Pupils equal and reactive to light and accomodation. Conjunctivae clear without exudate.  Sclerae anicteric. Oral mucosa is pink, moist, and intact without lesions.  Oropharynx is pink without lesions or erythema.  Lymph: No cervical, supraclavicular, infraclavicular, or axillary lymphadenopathy noted on palpation.  Cardiovascular: Regular rate and rhythm without murmurs, rubs, or gallops. Respiratory: Clear to auscultation bilaterally. Chest expansion symmetric without accessory muscle use on inspiration or expiration.  GI: Abdomen soft and round. No tenderness to palpation. Bowel sounds normoactive in 4 quadrants. No hepatosplenomegaly.   GU: Deferred.  Musculoskeletal: Small ~ 1 cm soft tissue mass in lower lumbar  back adjacent and separate from spinal canal.  Non tender to palpation.  Fluctuant.  Muscle strength 5/5 in all extremities.   Neuro: No focal deficits. Steady gait.  Psych: Mood and affect normal and appropriate for situation.  Extremities: No edema, cyanosis, or clubbing.  Skin: Warm and dry. No open lesions noted.   LABORATORY DATA:  None for this visit.  DIAGNOSTIC IMAGING:  None for this visit.     ASSESSMENT AND PLAN:   1.Breast cancer: Stage 0 ductal carcinoma in situ of the right breast (12/2014), ER positive, PR positive, S/P bilateral mastectomy (02/2015) with no evidence of malignancy on the left, with prior diagnosis of stage II A invasive ductal carcinoma of the left breast, ER/PR positive, HER2/neu negative, status post left lumpectomy and sentinel lymph node biopsy followed by adjuvant doxorubicin and cyclophosphamide 6 followed by adjuvant RT and 5 years of tamoxifen (completed August 20110.   Kristin Adams is doing well without clinical symptoms worrisome for disease recurrence. She will follow-up with her medical oncologist,  Dr. Jana Hakim, in October 2017 with history and physical examination per surveillance protocol.  As she has undergone bilateral mastectomy, there is no need for adjuvant endocrine therapy at this time. A comprehensive survivorship care plan and treatment summary was reviewed with the patient today detailing her breast cancer diagnosis, treatment course, potential late/long-term effects of treatment, appropriate follow-up care with recommendations for the future, and patient education resources.  A copy of this summary, along with a letter will be sent to the patient's primary care provider via in basket message after today's visit.  Kristin Adams is welcome to return to the Survivorship Clinic in the future, as needed; no follow-up will be scheduled at this time.    2. Soft tissue mass in left back: I believe that this is most likely a lipoma or other benign soft tissue  finding and will locate copies of previous imaging.  I have offered Kristin Adams an ultrasound of this area to further characterize it. However, she would rather defer at this time until those records can be secured.  She will also check her files and notify us of where the studies were performed.  3. Cancer screening:  Due to Kristin Adams history and her age, she should receive screening for skin cancers, and colon cancer. She is S/P hysterectomy. The information and recommendations are listed on the patient's comprehensive care plan/treatment summary and were reviewed in detail with the patient.    4. Health maintenance and wellness promotion: Kristin Adams was encouraged to  consume 5-7 servings of fruits and vegetables per day. We reviewed the "Nutrition Rainbow" handout, as well as discussed recommendations to maximize nutrition and minimize recurrence, such as increased intake of fruits, vegetables, lean proteins, and minimizing the intake of red meats and processed foods.  She was also encouraged to engage in moderate to vigorous exercise for 30 minutes per day most days of the week. We discussed the [employer] fitness program, which is designed for cancer survivors to help them become more physically fit after cancer treatments.  She was instructed to limit her alcohol consumption and encouraged stop smoking completely.  A copy of the "Take Control of Your Health" brochure was given to her reinforcing these recommendations.   5. Support services/counseling: It is not uncommon for this period of the patient's cancer care trajectory to be one of many emotions and stressors.  We discussed an opportunity for her to participate in the next session of [facility] ("Finding Your New Normal") support group series designed for patients after they have completed treatment.  Kristin Adams was encouraged to take advantage of our many other support services programs, support groups, and/or counseling in coping with her new life as a  cancer survivor after completing anti-cancer treatment.  She was offered support today through active listening and expressive supportive counseling  She was given information regarding our available services and encouraged to contact me with any questions or for help enrolling in any of our support group/programs.    A total of 40 minutes of face-to-face time was spent with this patient with greater than 50% of that time in counseling and care-coordination.   Sylvan Cheese, NP  Survivorship Program [facility] [phone]   Note: PRIMARY CARE PROVIDER Mayra Neer, [place] [phone]

## 2015-05-22 ENCOUNTER — Encounter: Payer: Self-pay | Admitting: Nurse Practitioner

## 2015-06-03 ENCOUNTER — Encounter (HOSPITAL_BASED_OUTPATIENT_CLINIC_OR_DEPARTMENT_OTHER): Payer: Self-pay | Admitting: *Deleted

## 2015-06-08 ENCOUNTER — Other Ambulatory Visit: Payer: Self-pay | Admitting: Plastic Surgery

## 2015-06-08 DIAGNOSIS — Z9013 Acquired absence of bilateral breasts and nipples: Secondary | ICD-10-CM

## 2015-06-08 NOTE — H&P (Signed)
Kristin Adams is an 60 y.o. female.   Chief Complaint: acquired absence of breasts HPI: The patient is a 60 yo female here for history and physical prior to secondary breast reconstruction with removal of bilateral tissue expanders and placement of bilateral silicone implants.  History: She was diagnosed with right breast cancer after a routine mammogram. There were abnormal calcifications in the upper outer quadrant of the right breast (6 cm) close to the NAC. The biopsy showed low-grade DCIS, ER/PR positive. She is a patient of Dr. Marlou Starks. Genetic testing - pending. She underwent left breast cancer treatment in 2005-2006. She had an abnormal mammogram which lead to an ultrasound guided biopsy. She was diagnosed with left breast invasive mammary carcinoma, ER/PR positive, HER-2/neu negative. She underwent left lumpectomy with SLN dx (2 + nodes) followed by LN dissection and radiation/chemo/tamoxifen. She was staged at T1N1.  Bilateral expanders have 370/350 cc.   Past Medical History  Diagnosis Date  . IBS (irritable bowel syndrome)   . Headaches, cluster   . Wears glasses   . Cancer [facility])     Breast Cancer  . Arthritis   . Mitral valve prolapse   . Complication of anesthesia   . PONV (postoperative nausea and vomiting)   . Hypertension   . Anxiety     Past Surgical History  Procedure Laterality Date  . Vaginal hysterectomy  2000    3 LAP PROC FOR ENDOMET  . Breast lumpectomy  2005  . Mastectomy w/ sentinel node biopsy Right 02/19/2015    Procedure: RIGHT MASTECTOMY WITH SENTINEL LYMPH NODE BIOPSY;  Surgeon: Autumn Messing III, MD;  Location: [place];  Service: General;  Laterality: Right;  . Mastectomy, partial Left 02/19/2015    Procedure: LEFT MASTECTOMY PROPHYLACTIC;  Surgeon: Autumn Messing III, MD;  Location: [place];  Service: General;  Laterality: Left;  . Breast reconstruction with placement of tissue expander and flex hd (acellular hydrated dermis) Bilateral 02/19/2015    Procedure:  IMMEDIATE BILATERAL BREAST RECONSTRUCTION WITH PLACEMENT OF TISSUE EXPANDER AND FLEX HD (ACELLULAR HYDRATED DERMIS);  Surgeon: Loel Lofty Dillingham, DO;  Location: [place];  Service: Plastics;  Laterality: Bilateral;    Family History  Problem Relation Age of Onset  . Heart disease Father   . Cancer Mother 85    colon  . Diabetes Brother   . Heart disease Brother   . Diabetes Sister   . Breast cancer Sister 77  . Kidney disease Sister   . Melanoma Maternal Aunt   . Lung cancer Maternal Aunt    Social History:  reports that she has been smoking Cigarettes.  She has a 10.5 pack-year smoking history. She does not have any smokeless tobacco history on file. She reports that she drinks about 1.5 oz of alcohol per week. She reports that she does not use illicit drugs.  Allergies:  Allergies  Allergen Reactions  . Adhesive [Tape] Rash    blisters  . Corticosteroids Hives, Shortness Of Breath, Swelling and Rash  . Dexamethasone Nausea And Vomiting  . Eggs Or Egg-Derived Products Nausea And Vomiting  . Lactase Nausea Only  . Prednisone Nausea And Vomiting  . Fludrocortisone Rash  . Peanut-Containing Drug Products Rash    "suspected allergy"  . Shellfish Allergy Rash    "suspected list"     (Not in a hospital admission)  No results found for this or any previous visit (from the past 48 hour(s)). No results found.  Review of Systems  Constitutional: Negative.  HENT: Negative.   Eyes: Negative.   Respiratory: Negative.   Cardiovascular: Negative.   Gastrointestinal: Negative.   Genitourinary: Negative.   Musculoskeletal: Negative.   Skin: Negative.   Neurological: Negative.   Psychiatric/Behavioral: Negative.     There were no vitals taken for this visit. Physical Exam  Constitutional: She is oriented to person, place, and time. She appears well-developed and well-nourished.  HENT:  Head: Normocephalic and atraumatic.  Eyes: Conjunctivae and EOM are normal. Pupils are  equal, round, and reactive to light.  Cardiovascular: Normal rate.   Respiratory: Effort normal. No respiratory distress. She has no wheezes.  GI: Soft. She exhibits no distension. There is no guarding.  Neurological: She is alert and oriented to person, place, and time.  Skin: Skin is warm.  Psychiatric: She has a normal mood and affect. Her behavior is normal. Judgment and thought content normal.     Assessment/Plan Plan for removal of expanders of bilateral breasts and placement of silicone implants with possible liposuction and lipofilling.  Wallace Going, DO 06/08/2015, 7:27 AM

## 2015-06-10 ENCOUNTER — Encounter (HOSPITAL_BASED_OUTPATIENT_CLINIC_OR_DEPARTMENT_OTHER): Payer: Self-pay | Admitting: *Deleted

## 2015-06-10 ENCOUNTER — Ambulatory Visit (HOSPITAL_BASED_OUTPATIENT_CLINIC_OR_DEPARTMENT_OTHER)
Admission: RE | Admit: 2015-06-10 | Discharge: 2015-06-10 | Disposition: A | Discharge status: Home / Self Care | Payer: 59 | Source: Ambulatory Visit | Attending: Plastic Surgery | Admitting: Plastic Surgery

## 2015-06-10 ENCOUNTER — Ambulatory Visit (HOSPITAL_BASED_OUTPATIENT_CLINIC_OR_DEPARTMENT_OTHER): Payer: 59 | Admitting: Anesthesiology

## 2015-06-10 ENCOUNTER — Encounter (HOSPITAL_BASED_OUTPATIENT_CLINIC_OR_DEPARTMENT_OTHER)
Admission: RE | Disposition: A | Discharge status: Home / Self Care | Payer: Self-pay | Source: Ambulatory Visit | Attending: Plastic Surgery

## 2015-06-10 DIAGNOSIS — F419 Anxiety disorder, unspecified: Secondary | ICD-10-CM | POA: Insufficient documentation

## 2015-06-10 DIAGNOSIS — Z79899 Other long term (current) drug therapy: Secondary | ICD-10-CM | POA: Diagnosis not present

## 2015-06-10 DIAGNOSIS — Z853 Personal history of malignant neoplasm of breast: Secondary | ICD-10-CM | POA: Diagnosis not present

## 2015-06-10 DIAGNOSIS — I10 Essential (primary) hypertension: Secondary | ICD-10-CM | POA: Diagnosis not present

## 2015-06-10 DIAGNOSIS — Z9221 Personal history of antineoplastic chemotherapy: Secondary | ICD-10-CM | POA: Diagnosis not present

## 2015-06-10 DIAGNOSIS — Z9013 Acquired absence of bilateral breasts and nipples: Secondary | ICD-10-CM | POA: Insufficient documentation

## 2015-06-10 DIAGNOSIS — Z923 Personal history of irradiation: Secondary | ICD-10-CM | POA: Insufficient documentation

## 2015-06-10 DIAGNOSIS — F17210 Nicotine dependence, cigarettes, uncomplicated: Secondary | ICD-10-CM | POA: Diagnosis not present

## 2015-06-10 HISTORY — PX: REMOVAL OF BILATERAL TISSUE EXPANDERS WITH PLACEMENT OF BILATERAL BREAST IMPLANTS: SHX6431

## 2015-06-10 HISTORY — PX: LIPOSUCTION: SHX10

## 2015-06-10 LAB — POCT HEMOGLOBIN-HEMACUE: HEMOGLOBIN: 14.8 g/dL (ref 12.0–15.0)

## 2015-06-10 SURGERY — REMOVAL, TISSUE EXPANDER, BREAST, BILATERAL, WITH BILATERAL IMPLANT IMPLANT INSERTION
Anesthesia: General | Laterality: Bilateral

## 2015-06-10 MED ORDER — GLYCOPYRROLATE 0.2 MG/ML IJ SOLN
INTRAMUSCULAR | Status: AC
  Filled 2015-06-10: qty 1

## 2015-06-10 MED ORDER — PROPOFOL 10 MG/ML IV BOLUS
INTRAVENOUS | Status: DC | PRN
  Administered 2015-06-10: 200 mg via INTRAVENOUS

## 2015-06-10 MED ORDER — ONDANSETRON HCL 4 MG/2ML IJ SOLN: 4.0000 mg | Freq: Once | INTRAMUSCULAR | Status: DC | PRN

## 2015-06-10 MED ORDER — BUPIVACAINE-EPINEPHRINE (PF) 0.25% -1:200000 IJ SOLN
INTRAMUSCULAR | Status: AC
  Filled 2015-06-10: qty 60

## 2015-06-10 MED ORDER — EPINEPHRINE HCL 1 MG/ML IJ SOLN
INTRAMUSCULAR | Status: AC
  Filled 2015-06-10: qty 1

## 2015-06-10 MED ORDER — BUPIVACAINE-EPINEPHRINE 0.25% -1:200000 IJ SOLN
INTRAMUSCULAR | Status: DC | PRN
  Administered 2015-06-10: 15 mL

## 2015-06-10 MED ORDER — PROPOFOL 500 MG/50ML IV EMUL
INTRAVENOUS | Status: AC
  Filled 2015-06-10: qty 50

## 2015-06-10 MED ORDER — EPHEDRINE SULFATE 50 MG/ML IJ SOLN
INTRAMUSCULAR | Status: DC | PRN
  Administered 2015-06-10: 5 mg via INTRAVENOUS
  Administered 2015-06-10: 10 mg via INTRAVENOUS

## 2015-06-10 MED ORDER — EPHEDRINE SULFATE 50 MG/ML IJ SOLN
INTRAMUSCULAR | Status: AC
  Filled 2015-06-10: qty 1

## 2015-06-10 MED ORDER — ROCURONIUM BROMIDE 50 MG/5ML IV SOLN
INTRAVENOUS | Status: AC
  Filled 2015-06-10: qty 1

## 2015-06-10 MED ORDER — LIDOCAINE HCL 1 % IJ SOLN
INTRAVENOUS | Status: DC | PRN
  Administered 2015-06-10: 100 mL

## 2015-06-10 MED ORDER — FENTANYL CITRATE (PF) 100 MCG/2ML IJ SOLN
INTRAMUSCULAR | Status: AC
  Filled 2015-06-10: qty 2

## 2015-06-10 MED ORDER — CEFAZOLIN SODIUM-DEXTROSE 2-4 GM/100ML-% IV SOLN
INTRAVENOUS | Status: AC
  Filled 2015-06-10: qty 100

## 2015-06-10 MED ORDER — CEFAZOLIN SODIUM-DEXTROSE 2-4 GM/100ML-% IV SOLN
2.0000 g | INTRAVENOUS | Status: AC
  Administered 2015-06-10: 2 g via INTRAVENOUS

## 2015-06-10 MED ORDER — MIDAZOLAM HCL 2 MG/2ML IJ SOLN
1.0000 mg | INTRAMUSCULAR | Status: DC | PRN
  Administered 2015-06-10: 2 mg via INTRAVENOUS

## 2015-06-10 MED ORDER — LIDOCAINE HCL (PF) 1 % IJ SOLN
INTRAMUSCULAR | Status: AC
  Filled 2015-06-10: qty 10

## 2015-06-10 MED ORDER — LIDOCAINE HCL (CARDIAC) 20 MG/ML IV SOLN
INTRAVENOUS | Status: DC | PRN
  Administered 2015-06-10: 100 mg via INTRAVENOUS

## 2015-06-10 MED ORDER — LIDOCAINE-EPINEPHRINE 1 %-1:100000 IJ SOLN
INTRAMUSCULAR | Status: AC
  Filled 2015-06-10: qty 1

## 2015-06-10 MED ORDER — FENTANYL CITRATE (PF) 100 MCG/2ML IJ SOLN: 25.0000 ug | INTRAMUSCULAR | Status: DC | PRN

## 2015-06-10 MED ORDER — LIDOCAINE HCL (CARDIAC) 20 MG/ML IV SOLN
INTRAVENOUS | Status: AC
  Filled 2015-06-10: qty 5

## 2015-06-10 MED ORDER — ARTIFICIAL TEARS OP OINT
TOPICAL_OINTMENT | OPHTHALMIC | Status: AC
  Filled 2015-06-10: qty 3.5

## 2015-06-10 MED ORDER — FENTANYL CITRATE (PF) 100 MCG/2ML IJ SOLN
50.0000 ug | INTRAMUSCULAR | Status: DC | PRN
  Administered 2015-06-10: 100 ug via INTRAVENOUS

## 2015-06-10 MED ORDER — SCOPOLAMINE 1 MG/3DAYS TD PT72
1.0000 | MEDICATED_PATCH | Freq: Once | TRANSDERMAL | Status: AC | PRN
  Administered 2015-06-10: 1 via TRANSDERMAL

## 2015-06-10 MED ORDER — ONDANSETRON HCL 4 MG/2ML IJ SOLN
INTRAMUSCULAR | Status: DC | PRN
  Administered 2015-06-10: 4 mg via INTRAVENOUS

## 2015-06-10 MED ORDER — LACTATED RINGERS IV SOLN
INTRAVENOUS | Status: DC
  Administered 2015-06-10 (×2): via INTRAVENOUS

## 2015-06-10 MED ORDER — LIDOCAINE HCL (PF) 1 % IJ SOLN
INTRAMUSCULAR | Status: AC
  Filled 2015-06-10: qty 60

## 2015-06-10 MED ORDER — MIDAZOLAM HCL 2 MG/2ML IJ SOLN
INTRAMUSCULAR | Status: AC
  Filled 2015-06-10: qty 2

## 2015-06-10 MED ORDER — ROCURONIUM BROMIDE 100 MG/10ML IV SOLN
INTRAVENOUS | Status: DC | PRN
  Administered 2015-06-10: 40 mg via INTRAVENOUS

## 2015-06-10 MED ORDER — SCOPOLAMINE 1 MG/3DAYS TD PT72
MEDICATED_PATCH | TRANSDERMAL | Status: AC
  Filled 2015-06-10: qty 1

## 2015-06-10 MED ORDER — SUGAMMADEX SODIUM 200 MG/2ML IV SOLN
INTRAVENOUS | Status: AC
  Filled 2015-06-10: qty 2

## 2015-06-10 MED ORDER — GLYCOPYRROLATE 0.2 MG/ML IJ SOLN
0.2000 mg | Freq: Once | INTRAMUSCULAR | Status: AC | PRN
  Administered 2015-06-10: 0.2 mg via INTRAVENOUS

## 2015-06-10 MED ORDER — SUGAMMADEX SODIUM 200 MG/2ML IV SOLN
INTRAVENOUS | Status: DC | PRN
  Administered 2015-06-10: 200 mg via INTRAVENOUS

## 2015-06-10 MED ORDER — BUPIVACAINE-EPINEPHRINE (PF) 0.25% -1:200000 IJ SOLN
INTRAMUSCULAR | Status: AC
  Filled 2015-06-10: qty 30

## 2015-06-10 MED ORDER — SODIUM CHLORIDE 0.9 % IR SOLN
Status: DC | PRN
  Administered 2015-06-10: 1000 mL

## 2015-06-10 SURGICAL SUPPLY — 85 items
ADH SKN CLS APL DERMABOND .7 (GAUZE/BANDAGES/DRESSINGS) ×2
BAG DECANTER FOR FLEXI CONT (MISCELLANEOUS) ×3 IMPLANT
BINDER ABDOMINAL  9 SM 30-45 (SOFTGOODS)
BINDER ABDOMINAL 10 UNV 27-48 (MISCELLANEOUS) IMPLANT
BINDER ABDOMINAL 12 SM 30-45 (SOFTGOODS) IMPLANT
BINDER ABDOMINAL 9 SM 30-45 (SOFTGOODS) IMPLANT
BINDER BREAST LRG (GAUZE/BANDAGES/DRESSINGS) ×3 IMPLANT
BINDER BREAST MEDIUM (GAUZE/BANDAGES/DRESSINGS) IMPLANT
BINDER BREAST XLRG (GAUZE/BANDAGES/DRESSINGS) IMPLANT
BINDER BREAST XXLRG (GAUZE/BANDAGES/DRESSINGS) IMPLANT
BLADE HEX COATED 2.75 (ELECTRODE) ×3 IMPLANT
BLADE SURG 15 STRL LF DISP TIS (BLADE) ×2 IMPLANT
BLADE SURG 15 STRL SS (BLADE) ×6
BNDG GAUZE ELAST 4 BULKY (GAUZE/BANDAGES/DRESSINGS) ×6 IMPLANT
CANISTER SUCT 1200ML W/VALVE (MISCELLANEOUS) ×3 IMPLANT
CHLORAPREP W/TINT 26ML (MISCELLANEOUS) ×6 IMPLANT
CORDS BIPOLAR (ELECTRODE) IMPLANT
COVER BACK TABLE 60X90IN (DRAPES) ×3 IMPLANT
COVER MAYO STAND STRL (DRAPES) ×3 IMPLANT
DECANTER SPIKE VIAL GLASS SM (MISCELLANEOUS) IMPLANT
DERMABOND ADVANCED (GAUZE/BANDAGES/DRESSINGS) ×4
DERMABOND ADVANCED .7 DNX12 (GAUZE/BANDAGES/DRESSINGS) ×2 IMPLANT
DRAIN CHANNEL 19F RND (DRAIN) IMPLANT
DRAPE LAPAROSCOPIC ABDOMINAL (DRAPES) ×3 IMPLANT
DRSG PAD ABDOMINAL 8X10 ST (GAUZE/BANDAGES/DRESSINGS) ×12 IMPLANT
ELECT BLADE 4.0 EZ CLEAN MEGAD (MISCELLANEOUS) ×3
ELECT REM PT RETURN 9FT ADLT (ELECTROSURGICAL) ×3
ELECTRODE BLDE 4.0 EZ CLN MEGD (MISCELLANEOUS) ×1 IMPLANT
ELECTRODE REM PT RTRN 9FT ADLT (ELECTROSURGICAL) ×1 IMPLANT
EVACUATOR SILICONE 100CC (DRAIN) IMPLANT
EXTRACTOR CANIST REVOLVE STRL (CANNISTER) IMPLANT
FILTER LIPOSUCTION (MISCELLANEOUS) ×3 IMPLANT
GLOVE BIO SURGEON STRL SZ 6.5 (GLOVE) ×8 IMPLANT
GLOVE BIO SURGEONS STRL SZ 6.5 (GLOVE) ×4
GLOVE BIOGEL M STRL SZ7.5 (GLOVE) ×3 IMPLANT
GLOVE BIOGEL PI IND STRL 7.0 (GLOVE) ×1 IMPLANT
GLOVE BIOGEL PI INDICATOR 7.0 (GLOVE) ×2
GLOVE ECLIPSE 6.5 STRL STRAW (GLOVE) ×3 IMPLANT
GOWN STRL REUS W/ TWL LRG LVL3 (GOWN DISPOSABLE) ×3 IMPLANT
GOWN STRL REUS W/ TWL XL LVL3 (GOWN DISPOSABLE) ×1 IMPLANT
GOWN STRL REUS W/TWL LRG LVL3 (GOWN DISPOSABLE) ×9
GOWN STRL REUS W/TWL XL LVL3 (GOWN DISPOSABLE) ×3
IMPL BREAST GEL HP 375CC (Breast) ×2 IMPLANT
IMPLANT BREAST GEL HP 375CC (Breast) ×6 IMPLANT
IV NS 1000ML (IV SOLUTION)
IV NS 1000ML BAXH (IV SOLUTION) IMPLANT
IV NS 500ML (IV SOLUTION)
IV NS 500ML BAXH (IV SOLUTION) IMPLANT
KIT FILL SYSTEM UNIVERSAL (SET/KITS/TRAYS/PACK) IMPLANT
LINER CANISTER 1000CC FLEX (MISCELLANEOUS) ×6 IMPLANT
LIQUID BAND (GAUZE/BANDAGES/DRESSINGS) IMPLANT
NDL SAFETY ECLIPSE 18X1.5 (NEEDLE) ×2 IMPLANT
NEEDLE HYPO 18GX1.5 SHARP (NEEDLE) ×6
NEEDLE HYPO 25X1 1.5 SAFETY (NEEDLE) ×3 IMPLANT
PACK BASIN DAY SURGERY FS (CUSTOM PROCEDURE TRAY) ×3 IMPLANT
PAD ALCOHOL SWAB (MISCELLANEOUS) ×3 IMPLANT
PENCIL BUTTON HOLSTER BLD 10FT (ELECTRODE) ×3 IMPLANT
PIN SAFETY STERILE (MISCELLANEOUS) IMPLANT
REPLICARE--4"X4" ×3 IMPLANT
SIZER BREAST REUSE 375CC (SIZER) ×3
SIZER BRST REUSE P5.3 375CC (SIZER) ×1 IMPLANT
SIZER GENERIC MENTOR (SIZER) ×3 IMPLANT
SLEEVE SCD COMPRESS KNEE MED (MISCELLANEOUS) ×3 IMPLANT
SPONGE GAUZE 4X4 12PLY STER LF (GAUZE/BANDAGES/DRESSINGS) IMPLANT
SPONGE LAP 18X18 X RAY DECT (DISPOSABLE) ×12 IMPLANT
SUT MNCRL AB 4-0 PS2 18 (SUTURE) ×3 IMPLANT
SUT MON AB 3-0 SH 27 (SUTURE) ×3
SUT MON AB 3-0 SH27 (SUTURE) ×1 IMPLANT
SUT MON AB 5-0 PS2 18 (SUTURE) ×6 IMPLANT
SUT PDS AB 2-0 CT2 27 (SUTURE) IMPLANT
SUT VIC AB 3-0 SH 27 (SUTURE) ×6
SUT VIC AB 3-0 SH 27X BRD (SUTURE) ×2 IMPLANT
SUT VICRYL 4-0 PS2 18IN ABS (SUTURE) ×6 IMPLANT
SYR 20CC LL (SYRINGE) IMPLANT
SYR 3ML 18GX1 1/2 (SYRINGE) ×3 IMPLANT
SYR 50ML LL SCALE MARK (SYRINGE) ×3 IMPLANT
SYR BULB IRRIGATION 50ML (SYRINGE) ×3 IMPLANT
SYR CONTROL 10ML LL (SYRINGE) ×3 IMPLANT
TOWEL OR 17X24 6PK STRL BLUE (TOWEL DISPOSABLE) ×6 IMPLANT
TUBE CONNECTING 20'X1/4 (TUBING) ×1
TUBE CONNECTING 20X1/4 (TUBING) ×2 IMPLANT
TUBING INFILTRATION IT-10001 (TUBING) ×3 IMPLANT
TUBING SET GRADUATE ASPIR 12FT (MISCELLANEOUS) ×3 IMPLANT
UNDERPAD 30X30 (UNDERPADS AND DIAPERS) ×6 IMPLANT
YANKAUER SUCT BULB TIP NO VENT (SUCTIONS) ×3 IMPLANT

## 2015-06-10 NOTE — Anesthesia Postprocedure Evaluation (Signed)
Anesthesia Post Note  Patient: Kristin Adams  Procedure(s) Performed: Procedure(s) (LRB): REMOVAL OF BILATERAL TISSUE EXPANDERS WITH PLACEMENT OF BILATERAL BREAST IMPLANTS (Bilateral) LIPOSUCTION (Bilateral)  Patient location during evaluation: PACU Anesthesia Type: General Level of consciousness: awake and alert Pain management: pain level controlled Vital Signs Assessment: post-procedure vital signs reviewed and stable Respiratory status: spontaneous breathing, nonlabored ventilation, respiratory function stable and patient connected to nasal cannula oxygen Cardiovascular status: blood pressure returned to baseline and stable Postop Assessment: no signs of nausea or vomiting Anesthetic complications: no    Last Vitals:  Filed Vitals:   06/10/15 1130 06/10/15 1202  BP: 128/68 125/61  Pulse: 86 88  Temp:  36.6 C  Resp: 15 16    Last Pain:  Filed Vitals:   06/10/15 1203  PainSc: 2                  Catalina Gravel

## 2015-06-10 NOTE — Discharge Instructions (Addendum)
May shower on Friday Continue binder or sports bra at all times. No heavy lifting  Call your surgeon if you experience:   1.  Fever over 101.0. 2.  Inability to urinate. 3.  Nausea and/or vomiting. 4.  Extreme swelling or bruising at the surgical site. 5.  Continued bleeding from the incision. 6.  Increased pain, redness or drainage from the incision. 7.  Problems related to your pain medication. 8.  Any problems and/or concerns  Post Anesthesia Home Care Instructions  Activity: Get plenty of rest for the remainder of the day. A responsible adult should stay with you for 24 hours following the procedure.  For the next 24 hours, DO NOT: -Drive a car -Paediatric nurse -Drink alcoholic beverages -Take any medication unless instructed by your physician -Make any legal decisions or sign important papers.  Meals: Start with liquid foods such as gelatin or soup. Progress to regular foods as tolerated. Avoid greasy, spicy, heavy foods. If nausea and/or vomiting occur, drink only clear liquids until the nausea and/or vomiting subsides. Call your physician if vomiting continues.  Special Instructions/Symptoms: Your throat may feel dry or sore from the anesthesia or the breathing tube placed in your throat during surgery. If this causes discomfort, gargle with warm salt water. The discomfort should disappear within 24 hours.  If you had a scopolamine patch placed behind your ear for the management of post- operative nausea and/or vomiting:  1. The medication in the patch is effective for 72 hours, after which it should be removed.  Wrap patch in a tissue and discard in the trash. Wash hands thoroughly with soap and water. 2. You may remove the patch earlier than 72 hours if you experience unpleasant side effects which may include dry mouth, dizziness or visual disturbances. 3. Avoid touching the patch. Wash your hands with soap and water after contact with the patch.

## 2015-06-10 NOTE — Transfer of Care (Signed)
Immediate Anesthesia Transfer of Care Note  Patient: Kristin Adams  Procedure(s) Performed: Procedure(s): REMOVAL OF BILATERAL TISSUE EXPANDERS WITH PLACEMENT OF BILATERAL BREAST IMPLANTS (Bilateral) LIPOSUCTION (Bilateral)  Patient Location: PACU  Anesthesia Type:General  Level of Consciousness: awake  Airway & Oxygen Therapy: Patient Spontanous Breathing and Patient connected to face mask oxygen  Post-op Assessment: Report given to RN and Post -op Vital signs reviewed and stable  Post vital signs: Reviewed and stable  Last Vitals:  Filed Vitals:   06/10/15 0734  BP: 117/49  Pulse: 64  Temp: 36.7 C  Resp: 16    Complications: No apparent anesthesia complications

## 2015-06-10 NOTE — Interval H&P Note (Signed)
History and Physical Interval Note:  06/10/2015 8:17 AM  Kristin Adams  has presented today for surgery, with the diagnosis of RIGHT BREAST CANCER, HISTORY OF LEFT BREAST CANCER  The various methods of treatment have been discussed with the patient and family. After consideration of risks, benefits and other options for treatment, the patient has consented to  Procedure(s): REMOVAL OF BILATERAL TISSUE EXPANDERS WITH PLACEMENT OF BILATERAL BREAST IMPLANTS (Bilateral) POSSIBLE LIPOSUCTION WITH LIPOFILLING (Bilateral) as a surgical intervention .  The patient's history has been reviewed, patient examined, no change in status, stable for surgery.  I have reviewed the patient's chart and labs.  Questions were answered to the patient's satisfaction.     Wallace Going

## 2015-06-10 NOTE — Anesthesia Preprocedure Evaluation (Addendum)
Anesthesia Evaluation  Patient identified by MRN, date of birth, ID band Patient awake    Reviewed: Allergy & Precautions, NPO status , Patient's Chart, lab work & pertinent test results, reviewed documented beta blocker date and time   History of Anesthesia Complications (+) PONV and history of anesthetic complications  Airway Mallampati: II  TM Distance: >3 FB Neck ROM: Full    Dental  (+) Teeth Intact, Dental Advisory Given   Pulmonary Current Smoker,    Pulmonary exam normal breath sounds clear to auscultation       Cardiovascular hypertension, Pt. on medications and Pt. on home beta blockers (-) angina(-) CAD and (-) Past MI Normal cardiovascular exam+ Valvular Problems/Murmurs MVP  Rhythm:Regular Rate:Normal     Neuro/Psych  Headaches, PSYCHIATRIC DISORDERS Anxiety    GI/Hepatic negative GI ROS, Neg liver ROS,   Endo/Other  negative endocrine ROS  Renal/GU negative Renal ROS     Musculoskeletal  (+) Arthritis , Osteoarthritis,    Abdominal   Peds  Hematology negative hematology ROS (+)   Anesthesia Other Findings Day of surgery medications reviewed with the patient.  Reproductive/Obstetrics                            Anesthesia Physical Anesthesia Plan  ASA: II  Anesthesia Plan: General   Post-op Pain Management:    Induction: Intravenous  Airway Management Planned: Oral ETT  Additional Equipment:   Intra-op Plan:   Post-operative Plan: Extubation in OR  Informed Consent: I have reviewed the patients History and Physical, chart, labs and discussed the procedure including the risks, benefits and alternatives for the proposed anesthesia with the patient or authorized representative who has indicated his/her understanding and acceptance.   Dental advisory given  Plan Discussed with: CRNA  Anesthesia Plan Comments: (Risks/benefits of general anesthesia discussed with  patient including risk of damage to teeth, lips, gum, and tongue, nausea/vomiting, allergic reactions to medications, and the possibility of heart attack, stroke and death.  All patient questions answered.  Patient wishes to proceed.)        Anesthesia Quick Evaluation

## 2015-06-10 NOTE — Op Note (Signed)
Op report Bilateral Exchange   DATE OF OPERATION: 06/10/2015  LOCATION: [place]  SURGICAL DIVISION: Plastic Surgery  PREOPERATIVE DIAGNOSES:  1.History of bilateral breast cancer.  2. Acquired absence of bilateral breast.   POSTOPERATIVE DIAGNOSES:  1. History of bilateral breast cancer.  2. Acquired absence of bilateral breast.   PROCEDURE:  1. Bilateral exchange of tissue expanders for implants.  2. Bilateral capsulotomies for implant respositioning. 3. Liposuction for symmetry of the breast.  SURGEON: Claire Sanger Dillingham, DO  ASSISTANT: Shawn Rayburn, PA  ANESTHESIA:  General.   COMPLICATIONS: None.   IMPLANTS: Left - Mentor Smooth Round Ultra High Profile Gel 375cc. Ref [id number].  Serial Number [id number] - Mentor Smooth Round Ultra High Profile Gel 375bccc. Ref [id number].  Serial Number [id number]  INDICATIONS FOR PROCEDURE:  The patient, Kristin Adams, is a 60 y.o. female born on 06/25/1955, is here for treatment after bilateral mastectomies.  She had tissue expanders placed at the time of mastectomies. She now presents for exchange of her expanders for implants.  She requires capsulotomies to better position the implants. MRN: [id number]  CONSENT:  Informed consent was obtained directly from the patient. Risks, benefits and alternatives were fully discussed. Specific risks including but not limited to bleeding, infection, hematoma, seroma, scarring, pain, implant infection, implant extrusion, capsular contracture, asymmetry, wound healing problems, and need for further surgery were all discussed. The patient did have an ample opportunity to have her questions answered to her satisfaction.   DESCRIPTION OF PROCEDURE:  The patient was taken to the operating room. SCDs were placed and IV antibiotics were given. The patient's chest was prepped and draped in a sterile fashion. A time out was performed and the implants to be used were  identified.    On the right breast: One percent Lidocaine with epinephrine was used to infiltrate at the incision site. The old mastectomy scar was excised.  The mastectomy flaps from the superior and inferior flaps were raised over the pectoralis major muscle for several centimeters to minimize tension for the closure. The pectoralis was split inferior to the skin incision to expose and remove the tissue expander.  Inspection of the pocket showed a normal healthy capsule and good integration of the biologic matrix.  The pocket was irrigated with antibiotic solution.  Tumescent was placed in the lateral axillary area at the beginning of the case.  Liposuction was dons at the right lateral axillary area.  Circumferential capsulotomies were performed to allow for breast pocket expansion.  Measurements were made and a sizer used to confirm adequate pocket size for the implant dimensions.  Hemostasis was ensured with electrocautery. New gloves were placed. The implant was soaked in antibiotic solution and then placed in the pocket and oriented appropriately. The pectoralis major muscle and capsule on the anterior surface were re-closed with a 3-0 Monocryl suture. The remaining skin was closed with 4-0 Monocryl deep dermal and 5-0 Monocryl subcuticular stitches.   On the left breast: the #10 blade was used to make the incision at the site of the old mastectomy scar.  The mastectomy flaps from the superior and inferior flaps were raised over the pectoralis major muscle for several centimeters to minimize tension for the closure. The pectoralis was split inferior to the skin incision to expose and remove the tissue expander.  Inspection of the pocket showed a normal healthy capsule and good integration of the biologic matrix.  Tumescent was placed in the lateral axillary area at the  beginning of the case.  Liposuction was dons at the right lateral axillary area.  The medial bump was also done and there was a great  improvement.  Circumferential capsulotomies were performed to allow for breast pocket expansion.  Measurements were made and a sizer utilized to confirm adequate pocket size for the implant dimensions.  Hemostasis was ensured with the electrocautery.  New gloves were applied. The implant was soaked in antibiotic solution and placed in the pocket and oriented appropriately. The pectoralis major muscle and capsule on the anterior surface were re-closed with a 3-0 Monocryl suture. The remaining skin was closed with 4-0 Monocryl deep dermal and 5-0 Monocryl subcuticular stitches.  Dermabond was applied to the incision site. Restore was placed at the medial left breast and lateral right breast. A breast binder and ABDs were placed.  The patient was allowed to wake from anesthesia and taken to the recovery room in satisfactory condition.

## 2015-06-10 NOTE — Anesthesia Procedure Notes (Signed)
Procedure Name: Intubation Date/Time: 06/10/2015 8:41 AM Performed by: Lieutenant Diego Pre-anesthesia Checklist: Patient identified, Emergency Drugs available, Suction available and Patient being monitored Patient Re-evaluated:Patient Re-evaluated prior to inductionOxygen Delivery Method: Circle System Utilized Preoxygenation: Pre-oxygenation with 100% oxygen Intubation Type: IV induction Ventilation: Mask ventilation without difficulty Laryngoscope Size: Miller and 2 Grade View: Grade I Tube type: Oral Tube size: 7.0 mm Number of attempts: 1 Airway Equipment and Method: Stylet and Oral airway Placement Confirmation: ETT inserted through vocal cords under direct vision,  positive ETCO2 and breath sounds checked- equal and bilateral Secured at: 22 cm Tube secured with: Tape Dental Injury: Teeth and Oropharynx as per pre-operative assessment

## 2015-06-10 NOTE — Brief Op Note (Addendum)
06/10/2015  10:48 AM  PATIENT:  Kristin Adams  60 y.o. female  PRE-OPERATIVE DIAGNOSIS:  RIGHT BREAST CANCER, HISTORY OF LEFT BREAST CANCER  POST-OPERATIVE DIAGNOSIS:  RIGHT BREAST CANCER, HISTORY OF LEFT BREAST CANCER  PROCEDURE:  Procedure(s): REMOVAL OF BILATERAL TISSUE EXPANDERS WITH PLACEMENT OF BILATERAL BREAST IMPLANTS (Bilateral) LIPOSUCTION (Bilateral)  SURGEON:  Surgeon(s) and Role:    * Claire S Dillingham, DO - Primary  PHYSICIAN ASSISTANT: Shawn Rayburn, PA  ASSISTANTS: none   ANESTHESIA:   general  EBL:  Total I/O In: 1300 [I.V.:1300] Out: 75 [Blood:75]  BLOOD ADMINISTERED:none  DRAINS: none   LOCAL MEDICATIONS USED:  MARCAINE     SPECIMEN:  Source of Specimen:  bilateral breast fat and right breast mastectomy scar  DISPOSITION OF SPECIMEN:  PATHOLOGY  COUNTS:  YES  TOURNIQUET:  * No tourniquets in log *  DICTATION: .Dragon Dictation  PLAN OF CARE: Discharge to home after PACU  PATIENT DISPOSITION:  PACU - hemodynamically stable.   Delay start of Pharmacological VTE agent (>24hrs) due to surgical blood loss or risk of bleeding: no

## 2015-06-10 NOTE — H&P (View-Only) (Signed)
Kristin Adams is an 60 y.o. female.   Chief Complaint: acquired absence of breasts HPI: The patient is a 60 yo female here for history and physical prior to secondary breast reconstruction with removal of bilateral tissue expanders and placement of bilateral silicone implants.  History: Kristin Adams was diagnosed with right breast cancer after a routine mammogram. There were abnormal calcifications in the upper outer quadrant of the right breast (6 cm) close to the NAC. The biopsy showed low-grade DCIS, ER/PR positive. Kristin Adams is a patient of Dr. Marlou Starks. Genetic testing - pending. Kristin Adams underwent left breast cancer treatment in 2005-2006. Kristin Adams had an abnormal mammogram which lead to an ultrasound guided biopsy. Kristin Adams was diagnosed with left breast invasive mammary carcinoma, ER/PR positive, HER-2/neu negative. Kristin Adams underwent left lumpectomy with SLN dx (2 + nodes) followed by LN dissection and radiation/chemo/tamoxifen. Kristin Adams was staged at T1N1.  Bilateral expanders have 370/350 cc.   Past Medical History  Diagnosis Date  . IBS (irritable bowel syndrome)   . Headaches, cluster   . Wears glasses   . Cancer [facility])     Breast Cancer  . Arthritis   . Mitral valve prolapse   . Complication of anesthesia   . PONV (postoperative nausea and vomiting)   . Hypertension   . Anxiety     Past Surgical History  Procedure Laterality Date  . Vaginal hysterectomy  2000    3 LAP PROC FOR ENDOMET  . Breast lumpectomy  2005  . Mastectomy w/ sentinel node biopsy Right 02/19/2015    Procedure: RIGHT MASTECTOMY WITH SENTINEL LYMPH NODE BIOPSY;  Surgeon: Autumn Messing III, MD;  Location: [place];  Service: General;  Laterality: Right;  . Mastectomy, partial Left 02/19/2015    Procedure: LEFT MASTECTOMY PROPHYLACTIC;  Surgeon: Autumn Messing III, MD;  Location: [place];  Service: General;  Laterality: Left;  . Breast reconstruction with placement of tissue expander and flex hd (acellular hydrated dermis) Bilateral 02/19/2015    Procedure:  IMMEDIATE BILATERAL BREAST RECONSTRUCTION WITH PLACEMENT OF TISSUE EXPANDER AND FLEX HD (ACELLULAR HYDRATED DERMIS);  Surgeon: Loel Lofty Coretta Leisey, DO;  Location: [place];  Service: Plastics;  Laterality: Bilateral;    Family History  Problem Relation Age of Onset  . Heart disease Father   . Cancer Mother 2    colon  . Diabetes Brother   . Heart disease Brother   . Diabetes Sister   . Breast cancer Sister 26  . Kidney disease Sister   . Melanoma Maternal Aunt   . Lung cancer Maternal Aunt    Social History:  reports that Kristin Adams has been smoking Cigarettes.  Kristin Adams has a 10.5 pack-year smoking history. Kristin Adams does not have any smokeless tobacco history on file. Kristin Adams reports that Kristin Adams drinks about 1.5 oz of alcohol per week. Kristin Adams reports that Kristin Adams does not use illicit drugs.  Allergies:  Allergies  Allergen Reactions  . Adhesive [Tape] Rash    blisters  . Corticosteroids Hives, Shortness Of Breath, Swelling and Rash  . Dexamethasone Nausea And Vomiting  . Eggs Or Egg-Derived Products Nausea And Vomiting  . Lactase Nausea Only  . Prednisone Nausea And Vomiting  . Fludrocortisone Rash  . Peanut-Containing Drug Products Rash    "suspected allergy"  . Shellfish Allergy Rash    "suspected list"     (Not in a hospital admission)  No results found for this or any previous visit (from the past 48 hour(s)). No results found.  Review of Systems  Constitutional: Negative.  HENT: Negative.   Eyes: Negative.   Respiratory: Negative.   Cardiovascular: Negative.   Gastrointestinal: Negative.   Genitourinary: Negative.   Musculoskeletal: Negative.   Skin: Negative.   Neurological: Negative.   Psychiatric/Behavioral: Negative.     There were no vitals taken for this visit. Physical Exam  Constitutional: Kristin Adams is oriented to person, place, and time. Kristin Adams appears well-developed and well-nourished.  HENT:  Head: Normocephalic and atraumatic.  Eyes: Conjunctivae and EOM are normal. Pupils are  equal, round, and reactive to light.  Cardiovascular: Normal rate.   Respiratory: Effort normal. No respiratory distress. Kristin Adams has no wheezes.  GI: Soft. Kristin Adams exhibits no distension. There is no guarding.  Neurological: Kristin Adams is alert and oriented to person, place, and time.  Skin: Skin is warm.  Psychiatric: Kristin Adams has a normal mood and affect. Her behavior is normal. Judgment and thought content normal.     Assessment/Plan Plan for removal of expanders of bilateral breasts and placement of silicone implants with possible liposuction and lipofilling.  Wallace Going, DO 06/08/2015, 7:27 AM

## 2015-06-11 ENCOUNTER — Encounter (HOSPITAL_BASED_OUTPATIENT_CLINIC_OR_DEPARTMENT_OTHER): Payer: Self-pay | Admitting: Plastic Surgery

## 2015-11-17 ENCOUNTER — Other Ambulatory Visit (HOSPITAL_COMMUNITY): Payer: Self-pay | Admitting: Plastic Surgery

## 2015-11-17 ENCOUNTER — Ambulatory Visit (HOSPITAL_COMMUNITY)
Admission: RE | Admit: 2015-11-17 | Discharge: 2015-11-17 | Disposition: A | Discharge status: Home / Self Care | Payer: 59 | Source: Ambulatory Visit | Attending: Plastic Surgery | Admitting: Plastic Surgery

## 2015-11-17 DIAGNOSIS — R0789 Other chest pain: Secondary | ICD-10-CM | POA: Diagnosis not present

## 2015-11-17 DIAGNOSIS — R918 Other nonspecific abnormal finding of lung field: Secondary | ICD-10-CM | POA: Diagnosis not present

## 2015-11-20 ENCOUNTER — Ambulatory Visit (HOSPITAL_BASED_OUTPATIENT_CLINIC_OR_DEPARTMENT_OTHER): Payer: 59 | Admitting: Oncology

## 2015-11-20 VITALS — BP 130/59 | HR 71 | Temp 98.7°F | Resp 18 | Ht 65.0 in | Wt 158.0 lb

## 2015-11-20 DIAGNOSIS — Z170 Estrogen receptor positive status [ER+]: Secondary | ICD-10-CM

## 2015-11-20 DIAGNOSIS — D0511 Intraductal carcinoma in situ of right breast: Secondary | ICD-10-CM | POA: Insufficient documentation

## 2015-11-20 DIAGNOSIS — C50412 Malignant neoplasm of upper-outer quadrant of left female breast: Secondary | ICD-10-CM | POA: Diagnosis not present

## 2015-11-20 DIAGNOSIS — R0789 Other chest pain: Secondary | ICD-10-CM | POA: Diagnosis not present

## 2015-11-20 NOTE — Progress Notes (Signed)
[facility]  Telephone:([phone]) [phone] Fax:([phone]) [phone]     ID: Kristin Adams DOB: 01/05/1956  MR#: [id number]  QLB#:[id number]  Patient Care Team: Lupita Raider, MD as PCP - General (Family Medicine) Lowella Dell, MD (Hematology and Oncology) Antony Blackbird, MD (Radiation Oncology) Chevis Pretty III, MD as Consulting Physician (General Surgery) Peggye Form, DO as Attending Physician (Plastic Surgery) Salomon Fick, NP as Nurse Practitioner (Hematology and Oncology) PCP: Lupita Raider, MD OTHER MD:  CHIEF COMPLAINT: Ductal carcinoma in situ  CURRENT TREATMENT: observation   BREAST CANCER HISTORY: From the 03/17/2004 intake note:  The patient had a screening mammogram [facility] on 01-22-04.  The prior mammogram had been in August 2002.  This new screening mammogram showed a possible left breast mass, and the patient was brought back for additional studies on 02-10-04.  Spot compression views confirmed the presence of a spiculated mass in the 11:00 position of the left breast which could be palpated once it had been located by mammography.  Ultrasound showed a spiculated solid mass measuring up to 1.3 cm. The axilla on the left was unremarkable.  Accordingly, the patient proceeded to biopsy under ultrasound guidance on the same day, 02-10-04, and this showed ([id number]) an invasive mammary carcinoma which was ER positive at 90%, PR positive at 94%, and HER-2/neu negative by FISH.  With this information, the patient was referred to Dr. Maple Hudson and, after appropriate discussion, she underwent left lumpectomy with sentinel lymph node biopsy on 02-26-04.  The final pathology there ([id number]) showed one of the two sentinel lymph nodes to be positive for metastatic disease (greater than 2 mm.).  The mass itself measured 1.6 cm., it was Grade 2, margins were adequate, the patient accordingly staging as T1, N1.  With this information, she is referred here  for further evaluation and treatment.    Her subsequent history as far as her left breast cancer is concerned is as detailed below.  More recently however Kristin Adams had bilateral screening [facility]. This showed a suspicious area of calcification in the right breast which was further evaluated with right diagnostic mammography on 12/15/2014. The breast density was category B. In the upper outer quadrant of the right breast there were amorphus calcifications spanning approximately 6 cm in a segmental distribution. Biopsy of this area 12/16/2014 showed ([id number]) ductal carcinoma in situ, low-grade, estrogen receptor 90% positive, progesterone receptor 20% positive. A second area also showed DCIS and it was again estrogen receptor 100% positive and progesterone receptor 10% positive  Her subsequent history as detailed below  INTERVAL HISTORY: [name] returns today for evaluation of a new left chest wall pain accompanied by her husband Optometrist. Since her last visit here she underwent exchange of the tissue expanders for implants bilaterally, with bilateral capsulotomies and live post option for symmetry, all of this on 06/10/2015. The pathology from this procedure ([id number]) showed no evidence of malignancy.  More [place] and that night while lying on her stomach with her arm under her breasts, which is the way she has been doing it since the surgery, she developed a sharp chest wall pain radiating on the left side under the left breast. This has persisted. She brought it to Dr. Kittie Plater attention and she was reassured that this had nothing to do with the reconstruction . A chest x-ray was obtained 11/17/2015 to evaluate for possible rib fracture. This showed  a left lingular opacity which was hard to characterize. CT scan of the chest was suggested. The patient is here today to discuss that problem.   REVIEW OF  SYSTEMS: Champayne tells me just sitting still without moving she has a dull sensation in that area which is not quite a pain, perhaps a 3 out of 10. When she takes a deep breath it hurts more and when she starts moving and trying to be more active than the pain is sharp her. Aside from this she is doing fine, a little bit forgetful at times, not exercising regularly, and having more hot flashes than before. Once or twice a week she will wake up with a headache which lasts most of the day. Sometimes she takes Advil for this most of the time it dissipates on its own. It is not accompanied by visual changes, nausea, vomiting, or neck stiffness. In addition she denies any fever, bleeding, or unexplained fatigue or weight loss. A detailed review of systems was otherwise stable  PAST MEDICAL HISTORY: Past Medical History:  Diagnosis Date  . Anxiety   . Arthritis   . Cancer [facility])    Breast Cancer  . Complication of anesthesia   . Headaches, cluster   . Hypertension   . IBS (irritable bowel syndrome)   . Mitral valve prolapse   . PONV (postoperative nausea and vomiting)   . Wears glasses     PAST SURGICAL HISTORY: Past Surgical History:  Procedure Laterality Date  . BREAST LUMPECTOMY  2005  . BREAST RECONSTRUCTION WITH PLACEMENT OF TISSUE EXPANDER AND FLEX HD (ACELLULAR HYDRATED DERMIS) Bilateral 02/19/2015   Procedure: IMMEDIATE BILATERAL BREAST RECONSTRUCTION WITH PLACEMENT OF TISSUE EXPANDER AND FLEX HD (ACELLULAR HYDRATED DERMIS);  Surgeon: Loel Lofty Dillingham, DO;  Location: [place];  Service: Plastics;  Laterality: Bilateral;  . LIPOSUCTION Bilateral 06/10/2015   Procedure: LIPOSUCTION;  Surgeon: Wallace Going, DO;  Location: [place];  Service: Plastics;  Laterality: Bilateral;  . MASTECTOMY W/ SENTINEL NODE BIOPSY Right 02/19/2015   Procedure: RIGHT MASTECTOMY WITH SENTINEL LYMPH NODE BIOPSY;  Surgeon: Autumn Messing III, MD;  Location: [place];  Service: General;  Laterality:  Right;  . MASTECTOMY, PARTIAL Left 02/19/2015   Procedure: LEFT MASTECTOMY PROPHYLACTIC;  Surgeon: Autumn Messing III, MD;  Location: [place];  Service: General;  Laterality: Left;  . REMOVAL OF BILATERAL TISSUE EXPANDERS WITH PLACEMENT OF BILATERAL BREAST IMPLANTS Bilateral 06/10/2015   Procedure: REMOVAL OF BILATERAL TISSUE EXPANDERS WITH PLACEMENT OF BILATERAL BREAST IMPLANTS;  Surgeon: Wallace Going, DO;  Location: [place];  Service: Plastics;  Laterality: Bilateral;  . VAGINAL HYSTERECTOMY  2000   3 LAP PROC FOR ENDOMET    FAMILY HISTORY Family History  Problem Relation Age of Onset  . Heart disease Father   . Cancer Mother 30    colon  . Diabetes Brother   . Heart disease Brother   . Diabetes Sister   . Breast cancer Sister 2  . Kidney disease Sister   . Melanoma Maternal Aunt   . Lung cancer Maternal Aunt   The patient's father died at the age of 79 from congestive heart failure.  The patient's mother died at age 32. She had colon cancer..  The patient has two brothers and two sisters. One sister was diagnosed with breast cancer at age 36.  There is no other history of ovarian or breast cancer in the family.    GYNECOLOGIC HISTORY:  No LMP recorded.  Patient has had a hysterectomy. Menarche age 79, first live birth age 56, which increases the risk of breast cancer. She is G1, P1. She underwent hysterectomy with bilateral salpingo-oophorectomy in 2000. She used hormone replacement approximately 10 years  SOCIAL HISTORY:  She works in Animal nutritionist for [employer].  Her husband, Rush Landmark, who is present today, worked as a Dealer with the [employer] but is now retired.  Their daughter, Ace Gins lives in [place] and is a stay-at-home mom.  ADVANCED DIRECTIVES: In place   HEALTH MAINTENANCE: Social History  Substance Use Topics  . Smoking status: Current Every Day Smoker    Packs/day: 0.25    Years: 42.00    Types: Cigarettes  .  Smokeless tobacco: Not on file  . Alcohol use 1.5 oz/week    3 Standard drinks or equivalent per week     Comment: WINE      Colonoscopy: 2013/[facility]  PAP: Status post hysterectomy  Bone density: 2015/osteopenia  Lipid panel:  Allergies  Allergen Reactions  . Corticosteroids Hives, Shortness Of Breath, Swelling and Rash  . Dexamethasone Hives and Swelling  . Eggs Or Egg-Derived Products Hives and Swelling  . Lactase Anaphylaxis  . Prednisone Hives and Swelling  . Adhesive [Tape] Rash    blisters  . Fludrocortisone Rash  . Peanut-Containing Drug Products Rash    "suspected allergy"  . Shellfish Allergy Rash    "suspected list"    Current Outpatient Prescriptions  Medication Sig Dispense Refill  . ALPRAZolam (XANAX) 0.5 MG tablet Take 0.5 mg by mouth at bedtime as needed for anxiety.     . B Complex-C (SUPER B COMPLEX PO) Take 1 tablet by mouth daily.     . Calcium Carbonate-Vitamin D (CALCIUM + D PO) Take 1,200 mg by mouth daily.      . Cholecalciferol (VITAMIN D) 1000 UNITS capsule Take 1,000 Units by mouth daily.      . diphenhydrAMINE (BENADRYL) 25 mg capsule Take 25 mg by mouth every 8 (eight) hours as needed for itching or allergies.  30 capsule 0  . escitalopram (LEXAPRO) 20 MG tablet Take 20 mg by mouth daily.      . halobetasol (ULTRAVATE) 0.05 % cream Apply 1 application topically daily.  3  . metoprolol (TOPROL-XL) 50 MG 24 hr tablet Take 25 mg by mouth daily.    . Multiple Vitamin (MULTIVITAMIN PO) Take 1 tablet by mouth daily.      No current facility-administered medications for this visit.     OBJECTIVE: Middle-aged white woman in no acute distress Vitals:   11/20/15 1023  BP: (!) 130/59  Pulse: 71  Resp: 18  Temp: 98.7 F (37.1 C)     Body mass index is 26.29 kg/m.    ECOG FS:1 - Symptomatic but completely ambulatory  Sclerae unicteric, EOMs intact Oropharynx clear and moist No cervical or supraclavicular adenopathy Lungs no rales or rhonchi Heart  regular rate and rhythm Abd soft, nontender, positive bowel sounds MSK no focal spinal tenderness, no upper extremity lymphedema Neuro: nonfocal, well oriented, appropriate affect Breasts: Status post bilateral mastectomies with implants in place. On the right there are no suspicious findings and no tenderness to palpation. On the left I palpated the entire axilla. Your and lateral aspect of the breast without eliciting any tenderness. The only area that is a little bit tender in is inferior, at the prior exit site of the drain. This is frequently a more sensitive area. There  is no evidence of local recurrence and palpation of the rib cage laterally and inferiorly elicits no tenderness; both axillae are benign   LAB RESULTS:  CMP     Component Value Date/Time   NA 136 02/11/2015 1548   NA 139 12/31/2014 1030   K 4.1 02/11/2015 1548   K 4.4 12/31/2014 1030   CL 103 02/11/2015 1548   CL 106 04/16/2012 1410   CO2 27 02/11/2015 1548   CO2 29 12/31/2014 1030   GLUCOSE 92 02/11/2015 1548   GLUCOSE 84 12/31/2014 1030   GLUCOSE 80 04/16/2012 1410   BUN 13 02/11/2015 1548   BUN 15.1 12/31/2014 1030   CREATININE 1.16 (H) 02/11/2015 1548   CREATININE 0.9 12/31/2014 1030   CALCIUM 9.3 02/11/2015 1548   CALCIUM 10.2 12/31/2014 1030   PROT 7.5 12/31/2014 1030   ALBUMIN 4.5 12/31/2014 1030   AST 23 12/31/2014 1030   ALT 16 12/31/2014 1030   ALKPHOS 72 12/31/2014 1030   BILITOT 0.61 12/31/2014 1030   GFRNONAA 50 (L) 02/11/2015 1548   GFRAA 59 (L) 02/11/2015 1548    INo results found for: SPEP, UPEP  Lab Results  Component Value Date   WBC 8.5 02/11/2015   NEUTROABS 4.1 12/31/2014   HGB 14.8 06/10/2015   HCT 41.2 02/11/2015   MCV 97.6 02/11/2015   PLT 135 (L) 02/11/2015      Chemistry      Component Value Date/Time   NA 136 02/11/2015 1548   NA 139 12/31/2014 1030   K 4.1 02/11/2015 1548   K 4.4 12/31/2014 1030   CL 103 02/11/2015 1548   CL 106 04/16/2012 1410   CO2 27  02/11/2015 1548   CO2 29 12/31/2014 1030   BUN 13 02/11/2015 1548   BUN 15.1 12/31/2014 1030   CREATININE 1.16 (H) 02/11/2015 1548   CREATININE 0.9 12/31/2014 1030      Component Value Date/Time   CALCIUM 9.3 02/11/2015 1548   CALCIUM 10.2 12/31/2014 1030   ALKPHOS 72 12/31/2014 1030   AST 23 12/31/2014 1030   ALT 16 12/31/2014 1030   BILITOT 0.61 12/31/2014 1030       Lab Results  Component Value Date   LABCA2 18 04/16/2012    No components found for: [id number]  No results for input(s): INR in the last 168 hours.  Urinalysis    Component Value Date/Time   LABSPEC 1.005 04/14/2010 1037   PHURINE 5.0 04/14/2010 1037   HGBUR Negative 04/14/2010 1037   BILIRUBINUR Negative 04/14/2010 1037   KETONESUR Negative 04/14/2010 1037   PROTEINUR Negative 04/14/2010 1037   NITRITE Negative 04/14/2010 1037   LEUKOCYTESUR Moderate 04/14/2010 1037    STUDIES: Dg Chest 2 View  Result Date: 11/17/2015 CLINICAL DATA:  60 y/o F; left chest wall pain radiating under the left breast for 3 weeks. History of left breast cancer post surgery, chemo, and radiation. No injury. EXAM: CHEST  2 VIEW COMPARISON:  02/27/2012 chest radiograph.  07/07/2014 CT chest. FINDINGS: Opacity within the left lingula best appreciated on sagittal. No pneumothorax or pleural effusion. Stable cardiomediastinal silhouette. No acute osseous abnormality is evident. Surgical clips project over the left axilla. IMPRESSION: Left lingula opacity may represent atelectasis due to chest wall pain and splinting. Given history, pneumonia, radiation changes, or invasive infiltrative process is possible. CT chest is recommended for further characterization. These results will be called to the ordering clinician or representative by the Radiologist Assistant, and communication documented in the PACS or  [facility]Vision Dashboard. Electronically Signed   By: Kristine Garbe M.D.   On: 11/17/2015 15:50    ASSESSMENT: 59 y.o. [place]  woman  (1) status post left upper outer quadrant lumpectomy and sentinel lymph node biopsy December of 2005 for a T1c N1, stage IIA invasive ductal carcinoma, grade 2, estrogen and progesterone receptor positive, HER2 negative,   (a) treated adjuvantly with doxorubicin /cyclophosphamide x 4 followed by paclitaxel x 6 completed May of 2006   (b) followed by radiation completed in August of 2006,  (c) followed by 5 years of tamoxifen completed in August of 2011.    (2) status post right breast upper outer quadrant biopsy 12/16/2014 for ductal carcinoma in situ, low-grade, estrogen and progesterone receptor positive, measuring approximately 6 cm  (3) genetics testing 02/04/2015 through the Breast/Ovarian cancer gene panel offered by [facility] no deleterious mutations in ATM, BARD1, BRCA1, BRCA2, BRIP1, CDH1, CHEK2, EPCAM, FANCC, MLH1, MSH2, MSH6, NBN, PALB2, PMS2, PTEN, RAD51C, RAD51D, TP53, and XRCC2.  (4) status post bilateral mastectomies 02/19/2015 showing  (a) on the left, no evidence of malignancy  (b) on the right, ductal carcinoma in situ, 2.0 cm, with negative margins and 0 of 5 lymph nodes involved  (c) definitive implant exchange for expanders bilaterally 06/10/2015  PLAN: Riona's left pain has a pleuritic component. I really don't see much in her chest x-ray but that is not the best way to evaluate this and we discussed obtaining a CT of the chest in the next few days so we can get a better picture.  My suspicion is that this may be in some way related to her April surgery but Y suddenly she would develop pain and Y that pain continues to persist really does require some evaluation.  I do not expect this to be cancer related so I am not making a return appointment here for her. If we do not see anything suspicious on the CT, which is my expectation, then she may take Aleve or Advil, or she can consider a Lidoderm patch if the pain is clearly localizable. She will be discussing all  this with Dr. Brigitte Pulse, her primary care physician, when she sees her later.  I will call her with the CT results. I am making no routine return appointment for her here but of course we'll be glad to see her at any point in the future if on when the need arises.  Chauncey Cruel, MD   11/20/2015 10:48 AM Medical Oncology and Hematology [facility] [address] [place], [place] [address] Tel. [phone]    Fax. [phone]

## 2015-11-23 ENCOUNTER — Other Ambulatory Visit: Payer: Self-pay | Admitting: *Deleted

## 2015-11-24 ENCOUNTER — Other Ambulatory Visit (HOSPITAL_BASED_OUTPATIENT_CLINIC_OR_DEPARTMENT_OTHER): Payer: 59

## 2015-11-24 ENCOUNTER — Other Ambulatory Visit: Payer: Self-pay | Admitting: *Deleted

## 2015-11-24 DIAGNOSIS — C50412 Malignant neoplasm of upper-outer quadrant of left female breast: Secondary | ICD-10-CM

## 2015-11-24 DIAGNOSIS — C50419 Malignant neoplasm of upper-outer quadrant of unspecified female breast: Secondary | ICD-10-CM

## 2015-11-24 LAB — CBC WITH DIFFERENTIAL/PLATELET
BASO%: 2.7 % — AB (ref 0.0–2.0)
Basophils Absolute: 0.2 10*3/uL — ABNORMAL HIGH (ref 0.0–0.1)
EOS%: 7.8 % — ABNORMAL HIGH (ref 0.0–7.0)
Eosinophils Absolute: 0.5 10*3/uL (ref 0.0–0.5)
HEMATOCRIT: 44 % (ref 34.8–46.6)
HGB: 14.6 g/dL (ref 11.6–15.9)
LYMPH%: 25.1 % (ref 14.0–49.7)
MCH: 32.6 pg (ref 25.1–34.0)
MCHC: 33.2 g/dL (ref 31.5–36.0)
MCV: 98.1 fL (ref 79.5–101.0)
MONO#: 0.5 10*3/uL (ref 0.1–0.9)
MONO%: 7.6 % (ref 0.0–14.0)
NEUT#: 3.6 10*3/uL (ref 1.5–6.5)
NEUT%: 56.8 % (ref 38.4–76.8)
PLATELETS: 165 10*3/uL (ref 145–400)
RBC: 4.48 10*6/uL (ref 3.70–5.45)
RDW: 13 % (ref 11.2–14.5)
WBC: 6.4 10*3/uL (ref 3.9–10.3)
lymph#: 1.6 10*3/uL (ref 0.9–3.3)

## 2015-11-24 LAB — COMPREHENSIVE METABOLIC PANEL
ALT: 15 U/L (ref 0–55)
ANION GAP: 9 meq/L (ref 3–11)
AST: 20 U/L (ref 5–34)
Albumin: 3.8 g/dL (ref 3.5–5.0)
Alkaline Phosphatase: 80 U/L (ref 40–150)
BUN: 9.8 mg/dL (ref 7.0–26.0)
CALCIUM: 9.6 mg/dL (ref 8.4–10.4)
CO2: 28 meq/L (ref 22–29)
CREATININE: 0.9 mg/dL (ref 0.6–1.1)
Chloride: 103 mEq/L (ref 98–109)
EGFR: 66 mL/min/{1.73_m2} — AB (ref >90)
Glucose: 89 mg/dl (ref 70–140)
Potassium: 4.3 mEq/L (ref 3.5–5.1)
Sodium: 140 mEq/L (ref 136–145)
Total Bilirubin: 0.33 mg/dL (ref 0.20–1.20)
Total Protein: 7.1 g/dL (ref 6.4–8.3)

## 2015-11-25 ENCOUNTER — Ambulatory Visit (HOSPITAL_COMMUNITY)
Admission: RE | Admit: 2015-11-25 | Discharge: 2015-11-25 | Disposition: A | Discharge status: Home / Self Care | Payer: 59 | Source: Ambulatory Visit | Attending: Oncology | Admitting: Oncology

## 2015-11-25 ENCOUNTER — Other Ambulatory Visit: Payer: Self-pay | Admitting: Oncology

## 2015-11-25 ENCOUNTER — Encounter: Payer: Self-pay | Admitting: Oncology

## 2015-11-25 ENCOUNTER — Encounter (HOSPITAL_COMMUNITY): Payer: Self-pay

## 2015-11-25 DIAGNOSIS — D0511 Intraductal carcinoma in situ of right breast: Secondary | ICD-10-CM

## 2015-11-25 DIAGNOSIS — R0789 Other chest pain: Secondary | ICD-10-CM | POA: Diagnosis not present

## 2015-11-25 DIAGNOSIS — C50412 Malignant neoplasm of upper-outer quadrant of left female breast: Secondary | ICD-10-CM | POA: Diagnosis present

## 2015-11-25 DIAGNOSIS — R937 Abnormal findings on diagnostic imaging of other parts of musculoskeletal system: Secondary | ICD-10-CM | POA: Diagnosis not present

## 2015-11-25 DIAGNOSIS — I2510 Atherosclerotic heart disease of native coronary artery without angina pectoris: Secondary | ICD-10-CM | POA: Diagnosis not present

## 2015-11-25 DIAGNOSIS — Z923 Personal history of irradiation: Secondary | ICD-10-CM | POA: Insufficient documentation

## 2015-11-25 MED ORDER — IOPAMIDOL (ISOVUE-300) INJECTION 61%
75.0000 mL | Freq: Once | INTRAVENOUS | Status: AC | PRN
  Administered 2015-11-25: 75 mL via INTRAVENOUS

## 2015-12-08 ENCOUNTER — Ambulatory Visit: Payer: 59 | Admitting: Oncology

## 2015-12-08 ENCOUNTER — Telehealth: Payer: Self-pay | Admitting: Oncology

## 2015-12-08 NOTE — Telephone Encounter (Signed)
patient left v/m to cancel appt. 12/08/15

## 2015-12-30 ENCOUNTER — Encounter (HOSPITAL_COMMUNITY): Payer: Self-pay

## 2016-05-23 DIAGNOSIS — D0511 Intraductal carcinoma in situ of right breast: Secondary | ICD-10-CM | POA: Diagnosis not present

## 2016-07-26 DIAGNOSIS — M25512 Pain in left shoulder: Secondary | ICD-10-CM | POA: Diagnosis not present

## 2016-07-26 DIAGNOSIS — M542 Cervicalgia: Secondary | ICD-10-CM | POA: Diagnosis not present

## 2016-07-26 DIAGNOSIS — M25511 Pain in right shoulder: Secondary | ICD-10-CM | POA: Diagnosis not present

## 2016-08-23 DIAGNOSIS — H524 Presbyopia: Secondary | ICD-10-CM | POA: Diagnosis not present

## 2016-09-22 DIAGNOSIS — Z0000 Encounter for general adult medical examination without abnormal findings: Secondary | ICD-10-CM | POA: Diagnosis not present

## 2016-09-22 DIAGNOSIS — Z1159 Encounter for screening for other viral diseases: Secondary | ICD-10-CM | POA: Diagnosis not present

## 2016-10-08 DIAGNOSIS — M1812 Unilateral primary osteoarthritis of first carpometacarpal joint, left hand: Secondary | ICD-10-CM | POA: Diagnosis not present

## 2016-10-08 DIAGNOSIS — S6992XA Unspecified injury of left wrist, hand and finger(s), initial encounter: Secondary | ICD-10-CM | POA: Diagnosis not present

## 2016-11-14 DIAGNOSIS — M8588 Other specified disorders of bone density and structure, other site: Secondary | ICD-10-CM | POA: Diagnosis not present

## 2016-11-24 ENCOUNTER — Ambulatory Visit
Admission: RE | Admit: 2016-11-24 | Discharge: 2016-11-24 | Disposition: A | Discharge status: Home / Self Care | Payer: Worker's Compensation | Source: Ambulatory Visit | Attending: Nurse Practitioner | Admitting: Nurse Practitioner

## 2016-11-24 ENCOUNTER — Other Ambulatory Visit: Payer: Self-pay | Admitting: Nurse Practitioner

## 2016-11-24 DIAGNOSIS — W19XXXA Unspecified fall, initial encounter: Secondary | ICD-10-CM

## 2016-11-28 DIAGNOSIS — D0511 Intraductal carcinoma in situ of right breast: Secondary | ICD-10-CM | POA: Diagnosis not present

## 2016-12-08 ENCOUNTER — Other Ambulatory Visit: Payer: Self-pay | Admitting: Family Medicine

## 2016-12-08 ENCOUNTER — Ambulatory Visit
Admission: RE | Admit: 2016-12-08 | Discharge: 2016-12-08 | Disposition: A | Discharge status: Home / Self Care | Payer: Worker's Compensation | Source: Ambulatory Visit | Attending: Family Medicine | Admitting: Family Medicine

## 2016-12-08 DIAGNOSIS — R52 Pain, unspecified: Secondary | ICD-10-CM

## 2016-12-21 DIAGNOSIS — M25511 Pain in right shoulder: Secondary | ICD-10-CM | POA: Diagnosis not present

## 2016-12-21 DIAGNOSIS — M542 Cervicalgia: Secondary | ICD-10-CM | POA: Diagnosis not present

## 2016-12-21 DIAGNOSIS — M25512 Pain in left shoulder: Secondary | ICD-10-CM | POA: Diagnosis not present

## 2016-12-22 DIAGNOSIS — M47816 Spondylosis without myelopathy or radiculopathy, lumbar region: Secondary | ICD-10-CM | POA: Diagnosis not present

## 2016-12-22 DIAGNOSIS — M5126 Other intervertebral disc displacement, lumbar region: Secondary | ICD-10-CM | POA: Diagnosis not present

## 2016-12-22 DIAGNOSIS — M545 Low back pain: Secondary | ICD-10-CM | POA: Diagnosis not present

## 2017-01-19 DIAGNOSIS — M5126 Other intervertebral disc displacement, lumbar region: Secondary | ICD-10-CM | POA: Diagnosis not present

## 2017-01-19 DIAGNOSIS — M47816 Spondylosis without myelopathy or radiculopathy, lumbar region: Secondary | ICD-10-CM | POA: Diagnosis not present

## 2017-03-23 DIAGNOSIS — M5126 Other intervertebral disc displacement, lumbar region: Secondary | ICD-10-CM | POA: Diagnosis not present

## 2017-03-23 DIAGNOSIS — M47816 Spondylosis without myelopathy or radiculopathy, lumbar region: Secondary | ICD-10-CM | POA: Diagnosis not present

## 2017-03-23 DIAGNOSIS — M5432 Sciatica, left side: Secondary | ICD-10-CM | POA: Diagnosis not present

## 2017-03-31 DIAGNOSIS — J069 Acute upper respiratory infection, unspecified: Secondary | ICD-10-CM | POA: Diagnosis not present

## 2017-04-11 ENCOUNTER — Encounter: Payer: Self-pay | Admitting: Genetic Counselor

## 2017-05-04 DIAGNOSIS — J329 Chronic sinusitis, unspecified: Secondary | ICD-10-CM | POA: Diagnosis not present

## 2017-05-25 DIAGNOSIS — M5432 Sciatica, left side: Secondary | ICD-10-CM | POA: Diagnosis not present

## 2017-05-25 DIAGNOSIS — M47816 Spondylosis without myelopathy or radiculopathy, lumbar region: Secondary | ICD-10-CM | POA: Diagnosis not present

## 2017-09-12 DIAGNOSIS — M542 Cervicalgia: Secondary | ICD-10-CM | POA: Diagnosis not present

## 2017-10-16 DIAGNOSIS — Z0000 Encounter for general adult medical examination without abnormal findings: Secondary | ICD-10-CM | POA: Diagnosis not present

## 2017-10-16 DIAGNOSIS — Z13220 Encounter for screening for lipoid disorders: Secondary | ICD-10-CM | POA: Diagnosis not present

## 2017-10-24 DIAGNOSIS — H524 Presbyopia: Secondary | ICD-10-CM | POA: Diagnosis not present

## 2018-04-23 DIAGNOSIS — H6590 Unspecified nonsuppurative otitis media, unspecified ear: Secondary | ICD-10-CM | POA: Diagnosis not present

## 2018-04-23 DIAGNOSIS — H9201 Otalgia, right ear: Secondary | ICD-10-CM | POA: Diagnosis not present

## 2018-11-23 ENCOUNTER — Other Ambulatory Visit: Payer: Self-pay | Admitting: Family Medicine

## 2018-11-23 DIAGNOSIS — M8580 Other specified disorders of bone density and structure, unspecified site: Secondary | ICD-10-CM

## 2019-04-08 ENCOUNTER — Other Ambulatory Visit: Payer: Self-pay

## 2019-04-08 ENCOUNTER — Ambulatory Visit
Admission: RE | Admit: 2019-04-08 | Discharge: 2019-04-08 | Disposition: A | Discharge status: Home / Self Care | Payer: 59 | Source: Ambulatory Visit | Attending: Family Medicine | Admitting: Family Medicine

## 2019-04-08 DIAGNOSIS — M8580 Other specified disorders of bone density and structure, unspecified site: Secondary | ICD-10-CM

## 2019-04-10 ENCOUNTER — Ambulatory Visit (INDEPENDENT_AMBULATORY_CARE_PROVIDER_SITE_OTHER): Payer: 59

## 2019-04-10 ENCOUNTER — Ambulatory Visit
Admission: EM | Admit: 2019-04-10 | Discharge: 2019-04-10 | Disposition: A | Discharge status: Home / Self Care | Payer: 59 | Attending: Emergency Medicine | Admitting: Emergency Medicine

## 2019-04-10 DIAGNOSIS — M25522 Pain in left elbow: Secondary | ICD-10-CM

## 2019-04-10 DIAGNOSIS — S52125A Nondisplaced fracture of head of left radius, initial encounter for closed fracture: Secondary | ICD-10-CM | POA: Diagnosis not present

## 2019-04-10 DIAGNOSIS — M25532 Pain in left wrist: Secondary | ICD-10-CM | POA: Diagnosis not present

## 2019-04-10 DIAGNOSIS — W19XXXA Unspecified fall, initial encounter: Secondary | ICD-10-CM

## 2019-04-10 DIAGNOSIS — M25512 Pain in left shoulder: Secondary | ICD-10-CM | POA: Diagnosis not present

## 2019-04-10 NOTE — ED Provider Notes (Signed)
[facility]    CSN: [id number] Arrival date & time: 04/10/19  1438      History   Chief Complaint Chief Complaint  Patient presents with  . Arm Injury    HPI Kristin Adams is a 64 y.o. female with history of breast cancer status post double mastectomy, arthritis presenting for left arm pain status post fall that occurred 30 to 45 minutes PTA.  States she fell to the back of her suburban while on her knees.  States she stretched out her arm to catch herself: Now endorsing left wrist, elbow pain.  No hip pain, head trauma, LOC.  No numbness, deformity, headache, chest pain, palpitations, difficulty breathing or abdominal pain.  Has not tried anything for this.   Past Medical History:  Diagnosis Date  . Anxiety   . Arthritis   . Cancer [facility])    Breast Cancer  . Complication of anesthesia   . Headaches, cluster   . IBS (irritable bowel syndrome)   . Mitral valve prolapse   . PONV (postoperative nausea and vomiting)   . Wears glasses     Patient Active Problem List   Diagnosis Date Noted  . Ductal carcinoma in situ (DCIS) of right breast 11/20/2015  . Breast cancer of upper-outer quadrant of left female breast ([place]) 03/11/2015  . Genetic testing 02/05/2015  . H/O malignant neoplasm of breast 12/30/2014  . Malignant neoplasm of upper-outer quadrant of female breast ([place]) 12/30/2014  . hx: breast cancer, Invasive mammary, left, receptor +, her2 - 01/07/2011    Past Surgical History:  Procedure Laterality Date  . BREAST LUMPECTOMY  2005  . BREAST RECONSTRUCTION WITH PLACEMENT OF TISSUE EXPANDER AND FLEX HD (ACELLULAR HYDRATED DERMIS) Bilateral 02/19/2015   Procedure: IMMEDIATE BILATERAL BREAST RECONSTRUCTION WITH PLACEMENT OF TISSUE EXPANDER AND FLEX HD (ACELLULAR HYDRATED DERMIS);  Surgeon: Loel Lofty Dillingham, DO;  Location: [place];  Service: Plastics;  Laterality: Bilateral;  . LIPOSUCTION Bilateral 06/10/2015   Procedure: LIPOSUCTION;  Surgeon: Wallace Going, DO;  Location: [place];  Service: Plastics;  Laterality: Bilateral;  . MASTECTOMY W/ SENTINEL NODE BIOPSY Right 02/19/2015   Procedure: RIGHT MASTECTOMY WITH SENTINEL LYMPH NODE BIOPSY;  Surgeon: Autumn Messing III, MD;  Location: [place];  Service: General;  Laterality: Right;  . MASTECTOMY, PARTIAL Left 02/19/2015   Procedure: LEFT MASTECTOMY PROPHYLACTIC;  Surgeon: Autumn Messing III, MD;  Location: [place];  Service: General;  Laterality: Left;  . REMOVAL OF BILATERAL TISSUE EXPANDERS WITH PLACEMENT OF BILATERAL BREAST IMPLANTS Bilateral 06/10/2015   Procedure: REMOVAL OF BILATERAL TISSUE EXPANDERS WITH PLACEMENT OF BILATERAL BREAST IMPLANTS;  Surgeon: Wallace Going, DO;  Location: [place];  Service: Plastics;  Laterality: Bilateral;  . VAGINAL HYSTERECTOMY  2000   3 LAP PROC FOR ENDOMET    OB History   No obstetric history on file.      Home Medications    Prior to Admission medications   Medication Sig Start Date End Date Taking? Authorizing Provider  ALPRAZolam Duanne Moron) 0.5 MG tablet Take 0.5 mg by mouth at bedtime as needed for anxiety.     [provider]  B Complex-C (SUPER B COMPLEX PO) Take 1 tablet by mouth daily.     [provider]  Calcium Carbonate-Vitamin D (CALCIUM + D PO) Take 1,200 mg by mouth daily.      [provider]  Cholecalciferol (VITAMIN D) 1000 UNITS capsule Take 1,000 Units by mouth daily.  [provider]  diphenhydrAMINE (BENADRYL) 25 mg capsule Take 25 mg by mouth every 8 (eight) hours as needed for itching or allergies.  12/31/14   Magrinat, Virgie Dad, MD  escitalopram (LEXAPRO) 20 MG tablet Take 20 mg by mouth daily.      [provider]  metoprolol (TOPROL-XL) 50 MG 24 hr tablet Take 25 mg by mouth daily.    [provider]  Multiple Vitamin (MULTIVITAMIN PO) Take 1 tablet by mouth daily.     [provider]    Family History Family History    Problem Relation Age of Onset  . Heart disease Father   . Cancer Mother 97       colon  . Diabetes Brother   . Heart disease Brother   . Diabetes Sister   . Breast cancer Sister 93  . Kidney disease Sister   . Melanoma Maternal Aunt   . Lung cancer Maternal Aunt     Social History Social History   Tobacco Use  . Smoking status: Former Smoker    Packs/day: 0.25    Years: 42.00    Pack years: 10.50    Types: Cigarettes  . Smokeless tobacco: Never Used  Substance Use Topics  . Alcohol use: Not Currently    Alcohol/week: 3.0 standard drinks    Types: 3 Standard drinks or equivalent per week    Comment: WINE   . Drug use: No     Allergies   Corticosteroids, Dexamethasone, Eggs or egg-derived products, Lactase, Prednisone, Adhesive [tape], Fludrocortisone, Peanut-containing drug products, and Shellfish allergy   Review of Systems As per HPI   Physical Exam Triage Vital Signs ED Triage Vitals  Enc Vitals Group     BP 04/10/19 1454 121/70     Pulse Rate 04/10/19 1454 78     Resp 04/10/19 1454 18     Temp 04/10/19 1454 98 F (36.7 C)     Temp Source 04/10/19 1454 Oral     SpO2 04/10/19 1454 97 %     Weight --      Height --      Head Circumference --      Peak Flow --      Pain Score 04/10/19 1455 7     Pain Loc --      Pain Edu? --      Excl. in [place]? --    No data found.  Updated Vital Signs BP 121/70 (BP Location: Left Arm)   Pulse 78   Temp 98 F (36.7 C) (Oral)   Resp 18   SpO2 97%   Visual Acuity Right Eye Distance:   Left Eye Distance:   Bilateral Distance:    Right Eye Near:   Left Eye Near:    Bilateral Near:     Physical Exam Constitutional:      General: She is not in acute distress. HENT:     Head: Normocephalic and atraumatic.  Eyes:     General: No scleral icterus.    Pupils: Pupils are equal, round, and reactive to light.  Cardiovascular:     Rate and Rhythm: Normal rate.  Pulmonary:     Effort: Pulmonary effort is  normal.  Musculoskeletal:     Comments: Left shoulder with AC tenderness.  No clavicular or scapular spine tenderness.  Patient has full active ROM, though does endorse pain in all directions. Left elbow with significant radial head tenderness.  Mild olecranon process tenderness.  No obvious effusion.  Does have some crepitus with ROM.  Decreased supination second pain. Left wrist with distal radial and ulnar tenderness.  No snuffbox tenderness.  Does have thenar eminence tenderness.  No obvious deformity and ROM is intact, though painful.  Skin:    Capillary Refill: Capillary refill takes less than 2 seconds.     Coloration: Skin is not jaundiced or pale.  Neurological:     General: No focal deficit present.     Mental Status: She is alert and oriented to person, place, and time.     Motor: Weakness present.     Comments: Weakness second to pain      UC Treatments / Results  Labs (all labs ordered are listed, but only abnormal results are displayed) Labs Reviewed - No data to display  EKG   Radiology DG Clavicle Left  Result Date: 04/10/2019 CLINICAL DATA:  Status post fall, left clavicular pain EXAM: LEFT CLAVICLE - 2+ VIEWS COMPARISON:  None. FINDINGS: No acute fracture or dislocation. Mild arthropathy of the acromioclavicular joint. Dystrophic calcification along the superior posterior glenoid. IMPRESSION: No acute osseous injury of the left clavicle. Electronically Signed   By: Kathreen Devoid   On: 04/10/2019 15:53   DG Elbow Complete Left  Result Date: 04/10/2019 CLINICAL DATA:  Left elbow pain secondary to a fall today. Painful range of motion. EXAM: LEFT ELBOW - COMPLETE 3+ VIEW COMPARISON:  None. FINDINGS: There is a subtle fracture neck of the radial head without impaction or angulation. Hemarthrosis. IMPRESSION: Nondisplaced fracture of the neck of the radial head. Electronically Signed   By: Lorriane Shire M.D.   On: 04/10/2019 15:52   DG Wrist Complete Left  Result Date:  04/10/2019 CLINICAL DATA:  Wrist pain secondary to a fall today. EXAM: LEFT WRIST - COMPLETE 3+ VIEW COMPARISON:  None. FINDINGS: There is no evidence of fracture or dislocation. Moderate osteoarthritis of the first [place] joint. Soft tissues are unremarkable. IMPRESSION: No acute abnormality. Arthritic changes as described. Electronically Signed   By: Lorriane Shire M.D.   On: 04/10/2019 15:53    Procedures Procedures (including critical care time)  Medications Ordered in UC Medications - No data to display  Initial Impression / Assessment and Plan / UC Course  I have reviewed the triage vital signs and the nursing notes.  Pertinent labs & imaging results that were available during my care of the patient were reviewed by me and considered in my medical decision making (see chart for details).     Patient afebrile, nontoxic in office today.  Given medical history, mechanism of injury with exam findings, x-rays of clavicle, elbow, wrist were obtained and reviewed by me and radiology.  Clavicle and wrist unremarkable for acute process.  Elbow significant for subtle fracture of neck of radial head without impaction or angulation.  Patient given sling: Instructed wear for 1 to 2 days for comfort.  Discussed importance of early rehab for range of motion.  Will follow up with Dr. Apolonio Schneiders tomorrow in office and practice RICE in the interim.  Return precautions discussed, patient verbalized understanding and is agreeable to plan. Final Clinical Impressions(s) / UC Diagnoses   Final diagnoses:  Closed nondisplaced fracture of head of left radius, initial encounter     Discharge Instructions     Recommend RICE: rest, ice, compression, elevation as needed for pain.   Cold therapy (ice packs) can be used to help swelling both after injury and after prolonged use of areas of chronic pain/aches.  For pain: recommend 350 mg-1000 mg of Tylenol (acetaminophen) and/or 200 mg - 800 mg of Advil (ibuprofen,  Motrin) every 8 hours as needed.  May alternate between the two throughout the day as they are generally safe to take together.  DO NOT exceed more than 3000 mg of Tylenol or 3200 mg of ibuprofen in a 24 hour period as this could damage your stomach, kidneys, liver, or increase your bleeding risk.    ED Prescriptions    None     PDMP not reviewed this encounter.   [place], [place], [place] 04/11/19 [phone]

## 2019-04-10 NOTE — ED Triage Notes (Signed)
Pt states fell out of the back of her suburban while on her knees and fell to lt hip block the fall with lt palm of hand and arm. Pt c/o severe lt arm pain from above elbow down to hand. Denies any other pain, denies LOC.

## 2019-04-10 NOTE — Discharge Instructions (Signed)
Recommend RICE: rest, ice, compression, elevation as needed for pain.   Cold therapy (ice packs) can be used to help swelling both after injury and after prolonged use of areas of chronic pain/aches.  For pain: recommend 350 mg-1000 mg of Tylenol (acetaminophen) and/or 200 mg - 800 mg of Advil (ibuprofen, Motrin) every 8 hours as needed.  May alternate between the two throughout the day as they are generally safe to take together.  DO NOT exceed more than 3000 mg of Tylenol or 3200 mg of ibuprofen in a 24 hour period as this could damage your stomach, kidneys, liver, or increase your bleeding risk. 

## 2019-04-11 ENCOUNTER — Telehealth: Payer: Self-pay | Admitting: Emergency Medicine

## 2019-04-11 NOTE — Telephone Encounter (Signed)
Pt called stating she needs a CD of her xrays for her ortho visit.  This RN made patient aware of need to go through medical records, unable to make CDs of xrays onsite.  Phone number provided and patient verbalized understanding.

## 2019-05-18 ENCOUNTER — Ambulatory Visit: Payer: 59

## 2021-03-10 DIAGNOSIS — Z853 Personal history of malignant neoplasm of breast: Secondary | ICD-10-CM | POA: Diagnosis not present

## 2021-03-10 DIAGNOSIS — R000 Tachycardia, unspecified: Secondary | ICD-10-CM | POA: Diagnosis not present

## 2021-03-10 DIAGNOSIS — R109 Unspecified abdominal pain: Secondary | ICD-10-CM | POA: Diagnosis not present

## 2021-03-10 DIAGNOSIS — J40 Bronchitis, not specified as acute or chronic: Secondary | ICD-10-CM | POA: Diagnosis not present

## 2021-03-10 DIAGNOSIS — Z1159 Encounter for screening for other viral diseases: Secondary | ICD-10-CM | POA: Diagnosis not present

## 2021-03-10 DIAGNOSIS — Z0000 Encounter for general adult medical examination without abnormal findings: Secondary | ICD-10-CM | POA: Diagnosis not present

## 2021-03-10 DIAGNOSIS — M899 Disorder of bone, unspecified: Secondary | ICD-10-CM | POA: Diagnosis not present

## 2021-03-10 DIAGNOSIS — Z720 Tobacco use: Secondary | ICD-10-CM | POA: Diagnosis not present

## 2021-03-10 DIAGNOSIS — Z79899 Other long term (current) drug therapy: Secondary | ICD-10-CM | POA: Diagnosis not present

## 2021-03-10 DIAGNOSIS — F3342 Major depressive disorder, recurrent, in full remission: Secondary | ICD-10-CM | POA: Diagnosis not present

## 2021-03-10 DIAGNOSIS — J019 Acute sinusitis, unspecified: Secondary | ICD-10-CM | POA: Diagnosis not present

## 2021-03-10 DIAGNOSIS — G4700 Insomnia, unspecified: Secondary | ICD-10-CM | POA: Diagnosis not present

## 2021-03-10 DIAGNOSIS — Z9013 Acquired absence of bilateral breasts and nipples: Secondary | ICD-10-CM | POA: Diagnosis not present

## 2021-03-10 DIAGNOSIS — Z136 Encounter for screening for cardiovascular disorders: Secondary | ICD-10-CM | POA: Diagnosis not present

## 2021-03-11 IMAGING — DX DG CLAVICLE*L*
2 series · 2 of 2 positions shown · non-contrast
Comparison: None.

CLINICAL DATA: Status post fall, left clavicular pain

EXAM:
LEFT CLAVICLE - 2+ VIEWS

[clavicle pa]
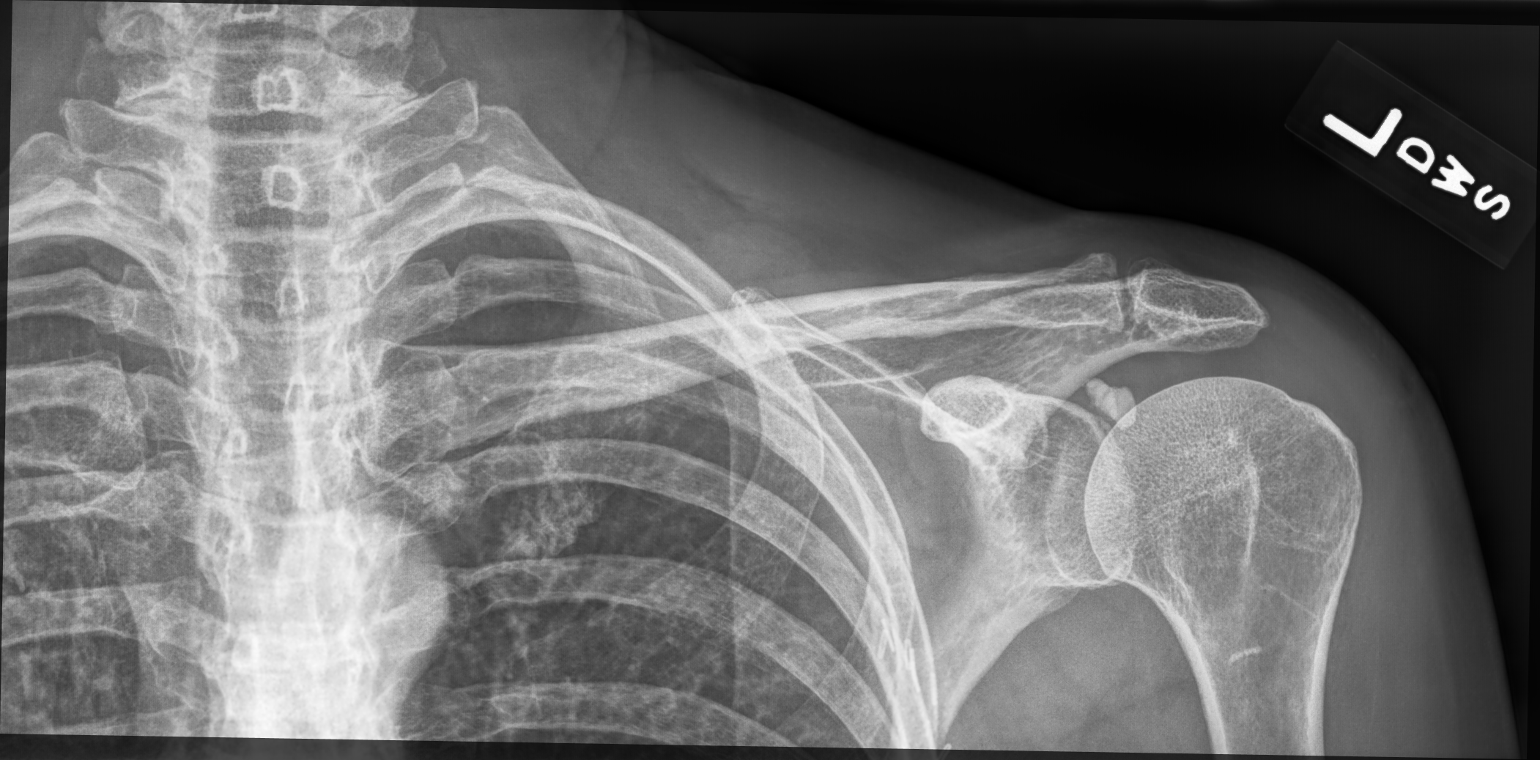

[clavicle tangential]
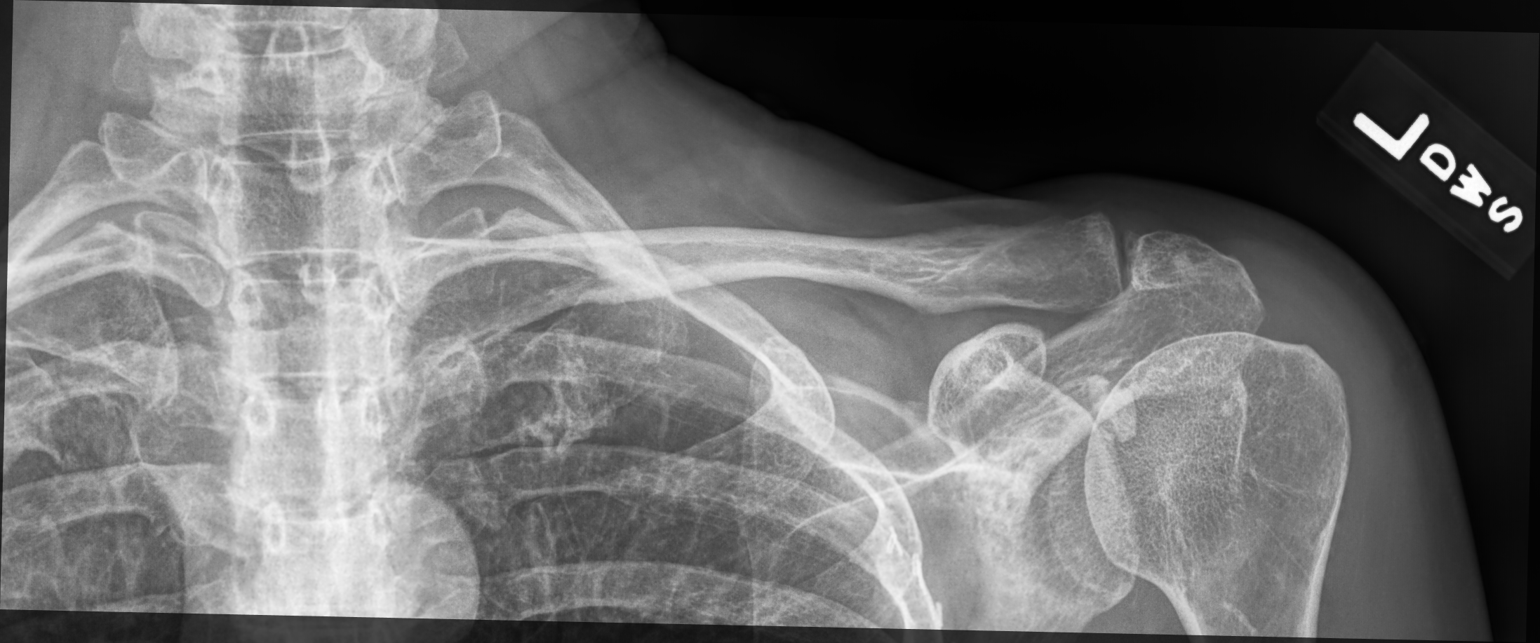

[2 of 2 positions shown; findings below may reference images not displayed]

FINDINGS: No acute fracture or dislocation. Mild arthropathy of the
acromioclavicular joint. Dystrophic calcification along the superior
posterior glenoid.
IMPRESSION: No acute osseous injury of the left clavicle.

## 2021-03-11 IMAGING — DX DG ELBOW COMPLETE 3+V*L*
4 series · 4 of 4 positions shown · non-contrast
Comparison: None.

CLINICAL DATA: Left elbow pain secondary to a fall today. Painful
range of motion.

EXAM:
LEFT ELBOW - COMPLETE 3+ VIEW

[elbow ap (1 of 3)]
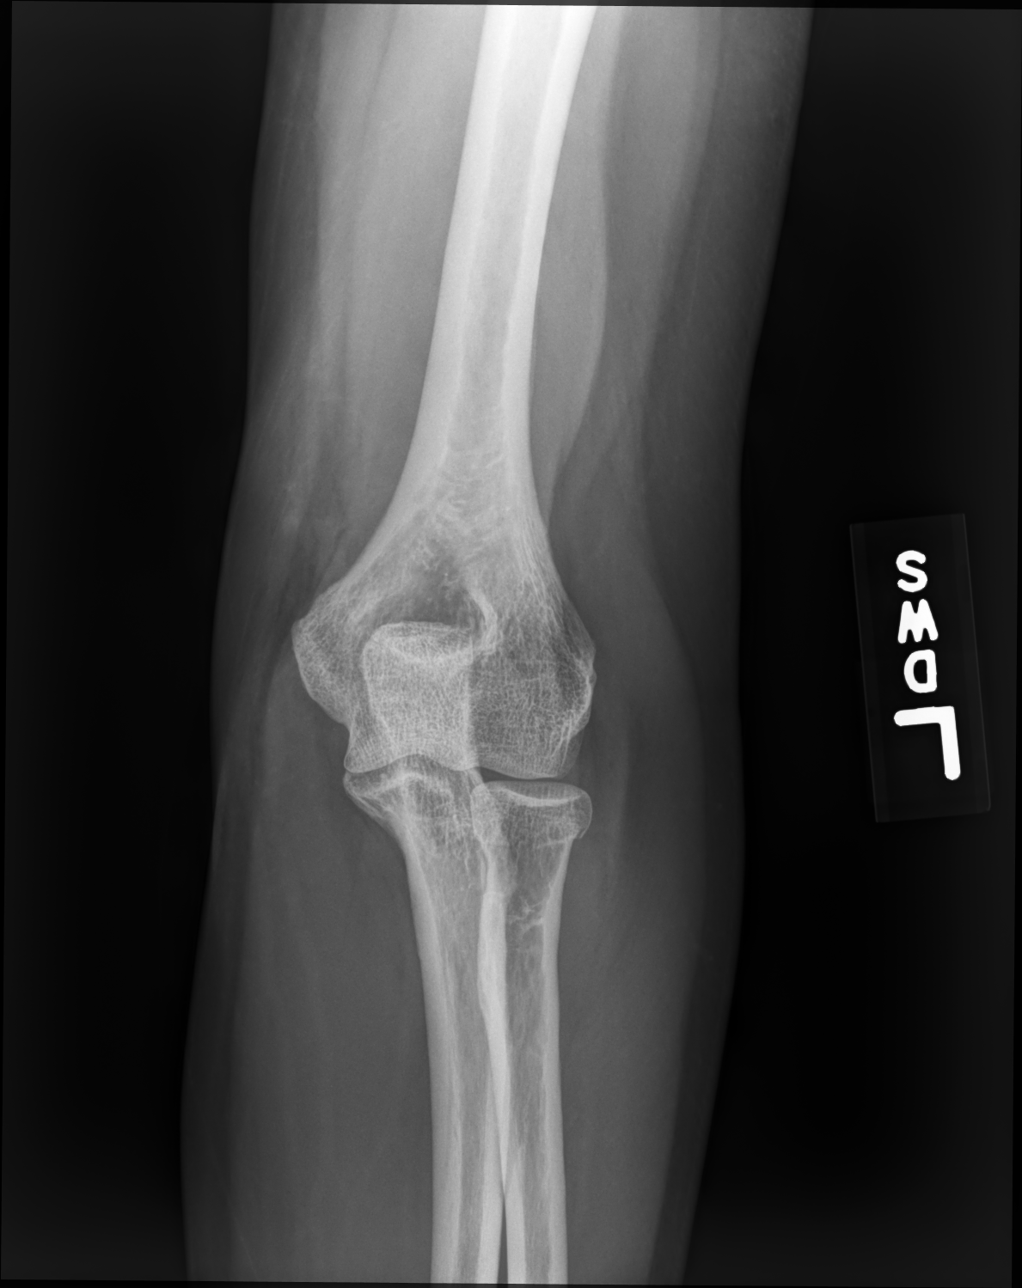

[elbow ap (2 of 3)]
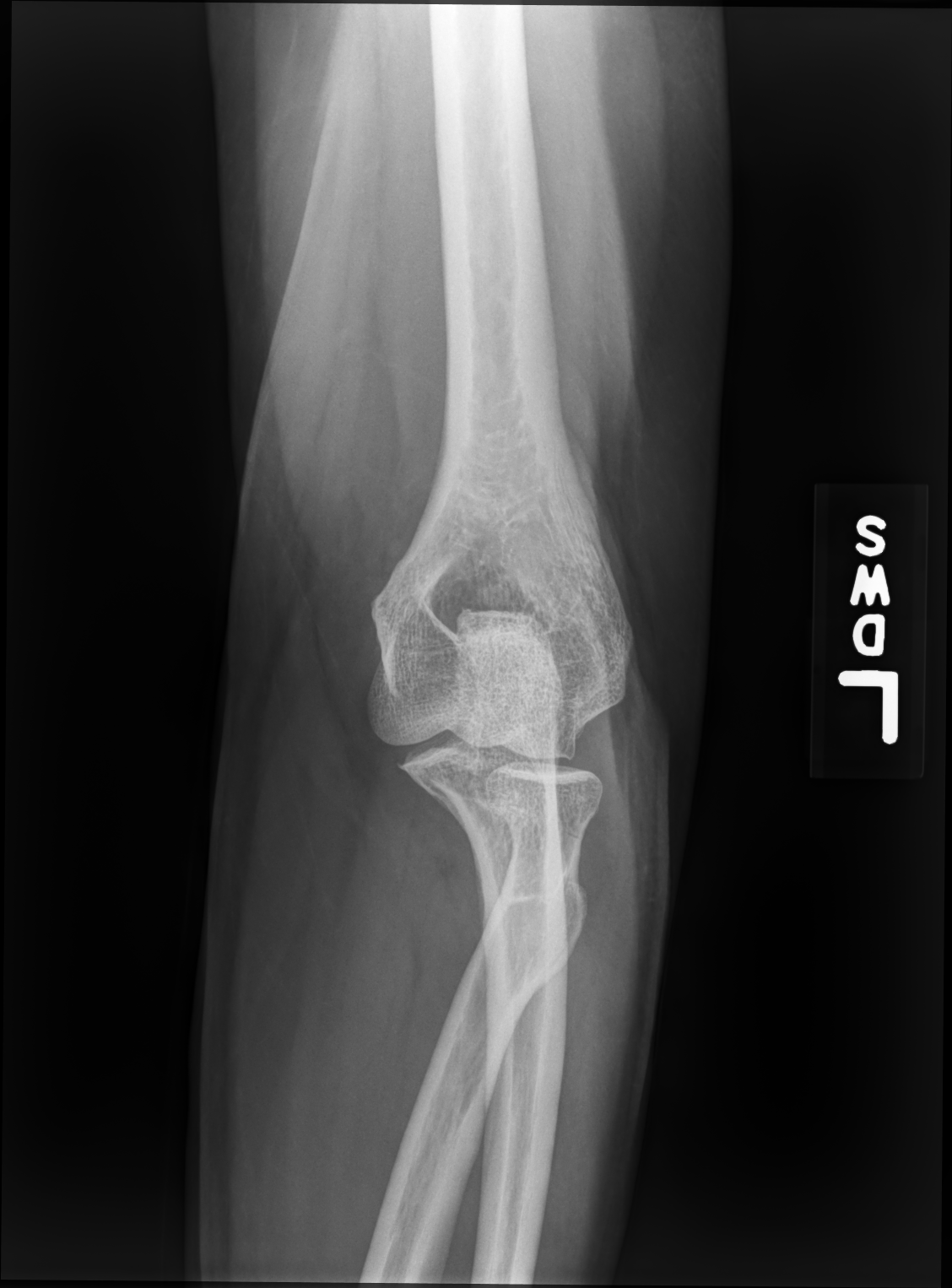

[elbow ap (3 of 3)]
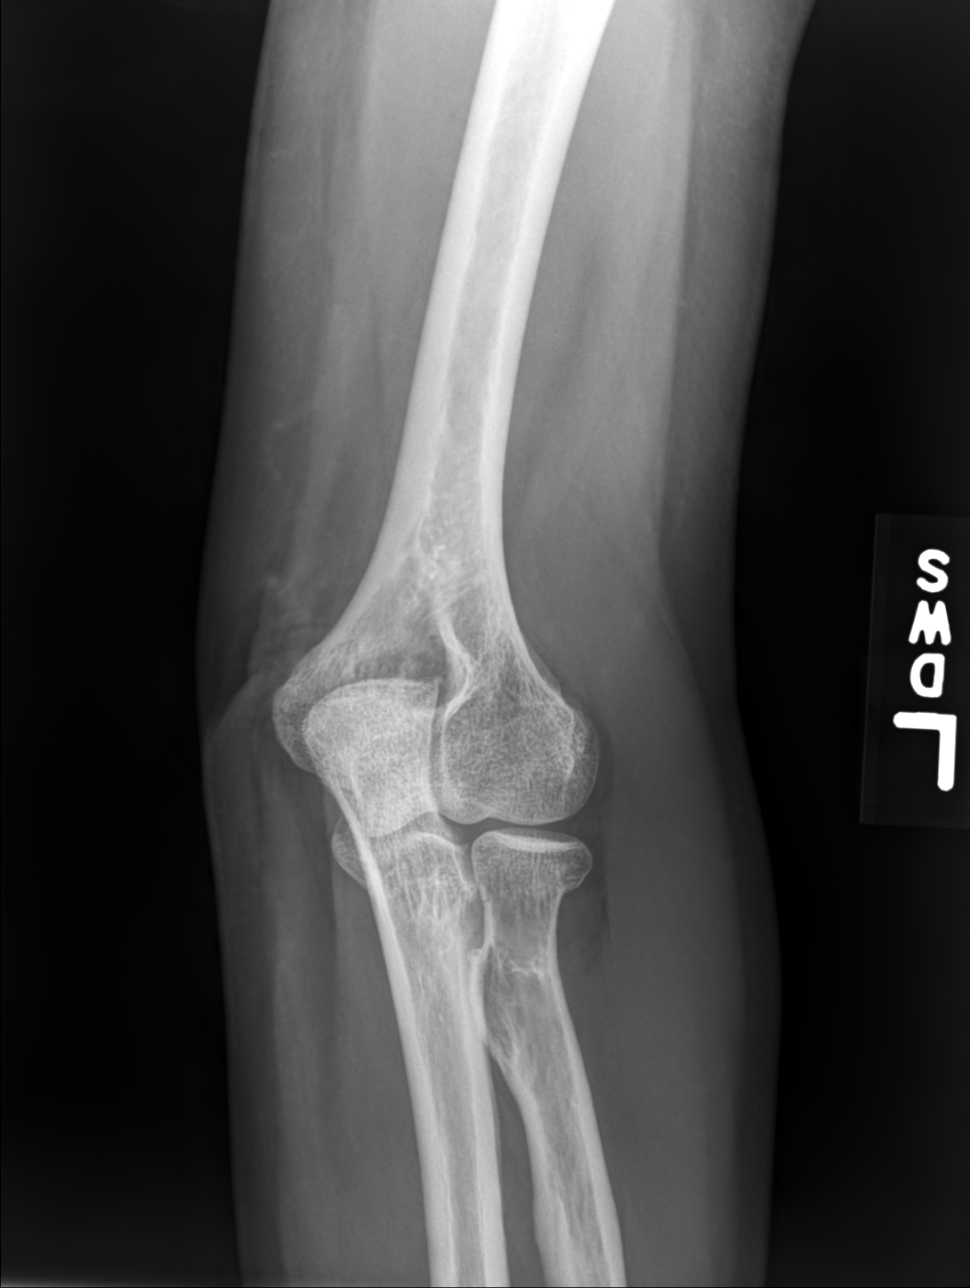

[elbow lat]
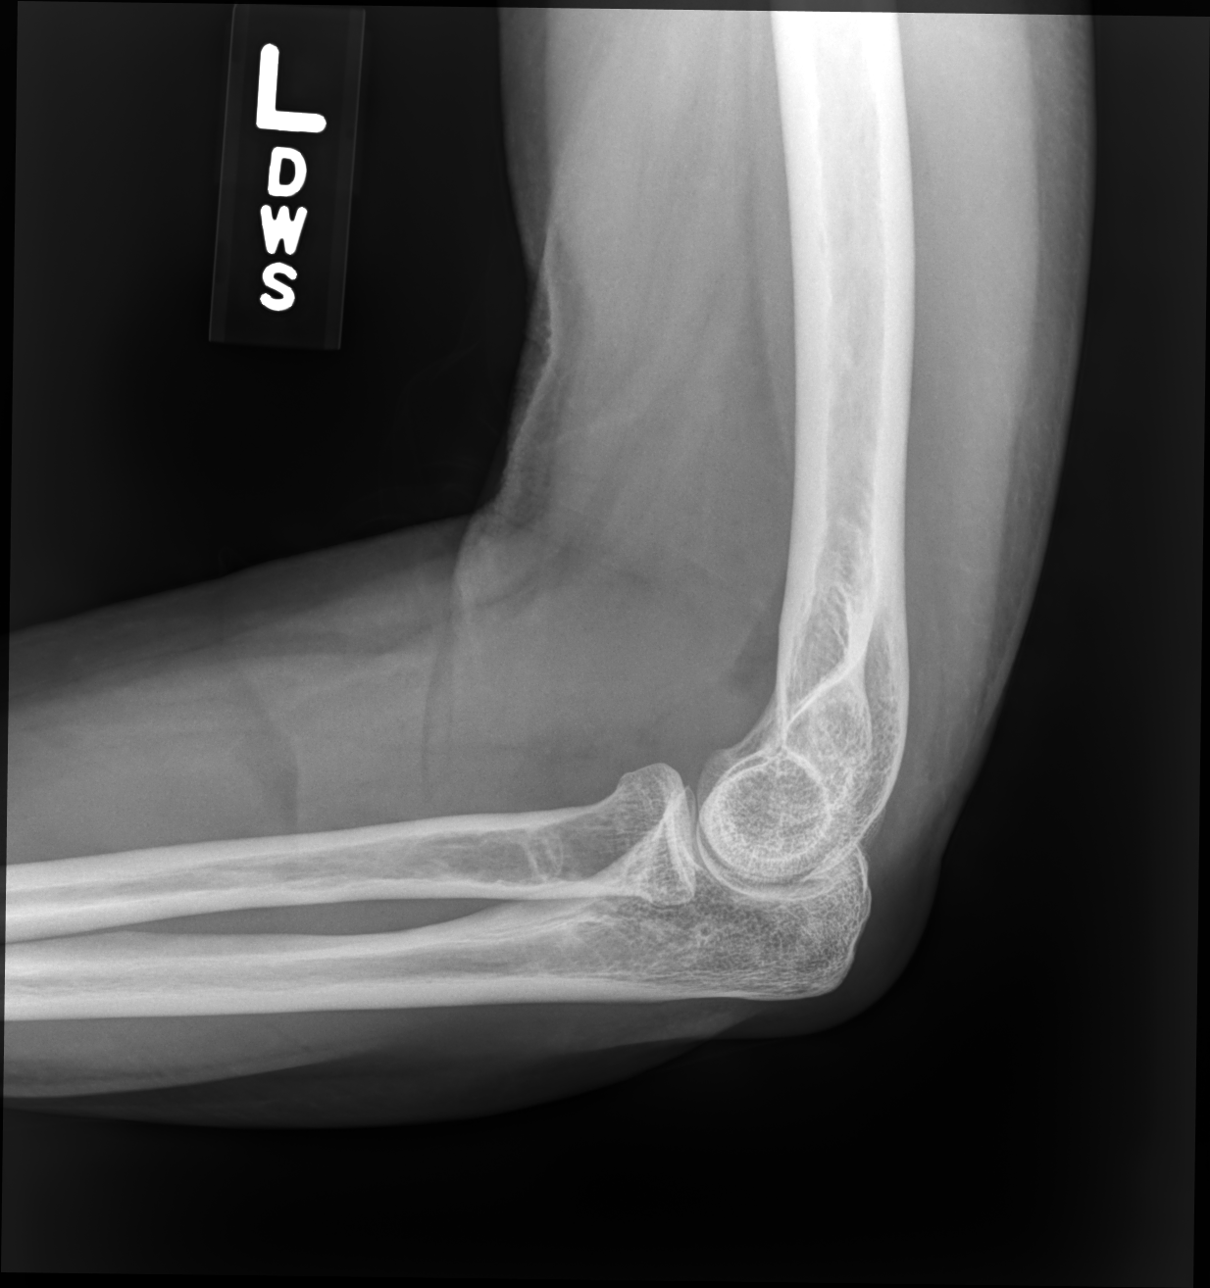

[4 of 4 positions shown; findings below may reference images not displayed]

FINDINGS: There is a subtle fracture neck of the radial head without impaction
or angulation. Hemarthrosis.
IMPRESSION: Nondisplaced fracture of the neck of the radial head.

## 2021-03-15 ENCOUNTER — Other Ambulatory Visit: Payer: Self-pay | Admitting: Family Medicine

## 2021-03-15 DIAGNOSIS — R109 Unspecified abdominal pain: Secondary | ICD-10-CM

## 2021-03-24 ENCOUNTER — Other Ambulatory Visit: Payer: Self-pay | Admitting: Family Medicine

## 2021-03-24 DIAGNOSIS — M8580 Other specified disorders of bone density and structure, unspecified site: Secondary | ICD-10-CM

## 2021-04-05 ENCOUNTER — Other Ambulatory Visit: Payer: Self-pay

## 2021-04-05 ENCOUNTER — Ambulatory Visit
Admission: RE | Admit: 2021-04-05 | Discharge: 2021-04-05 | Disposition: A | Discharge status: Home / Self Care | Payer: PPO | Source: Ambulatory Visit | Attending: Family Medicine | Admitting: Family Medicine

## 2021-04-05 DIAGNOSIS — R109 Unspecified abdominal pain: Secondary | ICD-10-CM

## 2021-04-05 DIAGNOSIS — M47816 Spondylosis without myelopathy or radiculopathy, lumbar region: Secondary | ICD-10-CM | POA: Diagnosis not present

## 2021-04-05 DIAGNOSIS — R1011 Right upper quadrant pain: Secondary | ICD-10-CM | POA: Diagnosis not present

## 2021-04-05 MED ORDER — IOPAMIDOL (ISOVUE-300) INJECTION 61%
100.0000 mL | Freq: Once | INTRAVENOUS | Status: AC | PRN
  Administered 2021-04-05: 100 mL via INTRAVENOUS

## 2021-04-14 ENCOUNTER — Ambulatory Visit
Admission: RE | Admit: 2021-04-14 | Discharge: 2021-04-14 | Disposition: A | Discharge status: Home / Self Care | Payer: PPO | Source: Ambulatory Visit | Attending: Family Medicine | Admitting: Family Medicine

## 2021-04-14 ENCOUNTER — Other Ambulatory Visit: Payer: Self-pay

## 2021-04-14 DIAGNOSIS — M8580 Other specified disorders of bone density and structure, unspecified site: Secondary | ICD-10-CM

## 2021-04-14 DIAGNOSIS — Z780 Asymptomatic menopausal state: Secondary | ICD-10-CM | POA: Diagnosis not present

## 2021-04-14 DIAGNOSIS — M85852 Other specified disorders of bone density and structure, left thigh: Secondary | ICD-10-CM | POA: Diagnosis not present

## 2021-04-15 ENCOUNTER — Other Ambulatory Visit: Payer: Self-pay | Admitting: Physician Assistant

## 2021-04-15 DIAGNOSIS — J329 Chronic sinusitis, unspecified: Secondary | ICD-10-CM | POA: Diagnosis not present

## 2021-04-15 DIAGNOSIS — F17200 Nicotine dependence, unspecified, uncomplicated: Secondary | ICD-10-CM | POA: Diagnosis not present

## 2021-04-15 DIAGNOSIS — R0981 Nasal congestion: Secondary | ICD-10-CM | POA: Diagnosis not present

## 2021-04-15 DIAGNOSIS — Z853 Personal history of malignant neoplasm of breast: Secondary | ICD-10-CM | POA: Diagnosis not present

## 2021-04-16 DIAGNOSIS — J329 Chronic sinusitis, unspecified: Secondary | ICD-10-CM | POA: Diagnosis not present

## 2021-04-22 ENCOUNTER — Other Ambulatory Visit: Payer: Self-pay

## 2021-05-10 ENCOUNTER — Other Ambulatory Visit: Payer: PPO

## 2021-07-19 DIAGNOSIS — J320 Chronic maxillary sinusitis: Secondary | ICD-10-CM | POA: Diagnosis not present

## 2021-07-23 DIAGNOSIS — J320 Chronic maxillary sinusitis: Secondary | ICD-10-CM | POA: Diagnosis not present

## 2021-07-23 DIAGNOSIS — F17210 Nicotine dependence, cigarettes, uncomplicated: Secondary | ICD-10-CM | POA: Diagnosis not present

## 2021-07-23 DIAGNOSIS — J0191 Acute recurrent sinusitis, unspecified: Secondary | ICD-10-CM | POA: Diagnosis not present

## 2021-09-30 DIAGNOSIS — Z203 Contact with and (suspected) exposure to rabies: Secondary | ICD-10-CM | POA: Diagnosis not present

## 2021-09-30 DIAGNOSIS — Z2914 Encounter for prophylactic rabies immune globin: Secondary | ICD-10-CM | POA: Diagnosis not present

## 2021-10-03 DIAGNOSIS — Z203 Contact with and (suspected) exposure to rabies: Secondary | ICD-10-CM | POA: Diagnosis not present

## 2021-10-07 ENCOUNTER — Ambulatory Visit (HOSPITAL_COMMUNITY)
Admission: EM | Admit: 2021-10-07 | Discharge: 2021-10-07 | Disposition: A | Discharge status: Home / Self Care | Payer: PPO | Attending: Internal Medicine | Admitting: Internal Medicine

## 2021-10-07 ENCOUNTER — Encounter (HOSPITAL_COMMUNITY): Payer: Self-pay | Admitting: Emergency Medicine

## 2021-10-07 DIAGNOSIS — Z203 Contact with and (suspected) exposure to rabies: Secondary | ICD-10-CM | POA: Diagnosis not present

## 2021-10-07 MED ORDER — RABIES VACCINE, PCEC IM SUSR
1.0000 mL | Freq: Once | INTRAMUSCULAR | Status: AC
  Administered 2021-10-07: 1 mL via INTRAMUSCULAR

## 2021-10-07 MED ORDER — RABIES VACCINE, PCEC IM SUSR
INTRAMUSCULAR | Status: AC
  Filled 2021-10-07: qty 1

## 2021-10-07 NOTE — ED Triage Notes (Signed)
Pt was getting fox off that was attacking her dog. The fox was killed and tested positive for rabies.  Pt had first sets of Rabies vaccinations at [facility].  Denies any problems today.

## 2021-10-14 ENCOUNTER — Ambulatory Visit (HOSPITAL_COMMUNITY): Payer: PPO

## 2021-10-14 DIAGNOSIS — Z203 Contact with and (suspected) exposure to rabies: Secondary | ICD-10-CM | POA: Diagnosis not present

## 2021-10-14 DIAGNOSIS — Z23 Encounter for immunization: Secondary | ICD-10-CM | POA: Diagnosis not present

## 2021-11-04 DIAGNOSIS — M7661 Achilles tendinitis, right leg: Secondary | ICD-10-CM | POA: Diagnosis not present

## 2021-11-04 DIAGNOSIS — M79645 Pain in left finger(s): Secondary | ICD-10-CM | POA: Diagnosis not present

## 2021-11-23 DIAGNOSIS — M25571 Pain in right ankle and joints of right foot: Secondary | ICD-10-CM | POA: Diagnosis not present

## 2022-02-01 DIAGNOSIS — J069 Acute upper respiratory infection, unspecified: Secondary | ICD-10-CM | POA: Diagnosis not present

## 2022-02-07 DIAGNOSIS — J320 Chronic maxillary sinusitis: Secondary | ICD-10-CM | POA: Diagnosis not present

## 2022-03-23 ENCOUNTER — Other Ambulatory Visit: Payer: Self-pay | Admitting: Family Medicine

## 2022-03-23 DIAGNOSIS — Z0000 Encounter for general adult medical examination without abnormal findings: Secondary | ICD-10-CM | POA: Diagnosis not present

## 2022-03-23 DIAGNOSIS — Z9013 Acquired absence of bilateral breasts and nipples: Secondary | ICD-10-CM | POA: Diagnosis not present

## 2022-03-23 DIAGNOSIS — M899 Disorder of bone, unspecified: Secondary | ICD-10-CM | POA: Diagnosis not present

## 2022-03-23 DIAGNOSIS — Z853 Personal history of malignant neoplasm of breast: Secondary | ICD-10-CM | POA: Diagnosis not present

## 2022-03-23 DIAGNOSIS — J329 Chronic sinusitis, unspecified: Secondary | ICD-10-CM | POA: Diagnosis not present

## 2022-03-23 DIAGNOSIS — G4700 Insomnia, unspecified: Secondary | ICD-10-CM | POA: Diagnosis not present

## 2022-03-23 DIAGNOSIS — L309 Dermatitis, unspecified: Secondary | ICD-10-CM | POA: Diagnosis not present

## 2022-03-23 DIAGNOSIS — M1990 Unspecified osteoarthritis, unspecified site: Secondary | ICD-10-CM | POA: Diagnosis not present

## 2022-03-23 DIAGNOSIS — Z79899 Other long term (current) drug therapy: Secondary | ICD-10-CM | POA: Diagnosis not present

## 2022-03-23 DIAGNOSIS — N63 Unspecified lump in unspecified breast: Secondary | ICD-10-CM

## 2022-03-23 DIAGNOSIS — Z720 Tobacco use: Secondary | ICD-10-CM | POA: Diagnosis not present

## 2022-03-23 DIAGNOSIS — F3342 Major depressive disorder, recurrent, in full remission: Secondary | ICD-10-CM | POA: Diagnosis not present

## 2022-03-25 ENCOUNTER — Other Ambulatory Visit: Payer: Self-pay | Admitting: Family Medicine

## 2022-03-25 ENCOUNTER — Ambulatory Visit
Admission: RE | Admit: 2022-03-25 | Discharge: 2022-03-25 | Disposition: A | Discharge status: Home / Self Care | Payer: PPO | Source: Ambulatory Visit | Attending: Family Medicine | Admitting: Family Medicine

## 2022-03-25 DIAGNOSIS — N644 Mastodynia: Secondary | ICD-10-CM | POA: Diagnosis not present

## 2022-03-25 DIAGNOSIS — N63 Unspecified lump in unspecified breast: Secondary | ICD-10-CM

## 2022-03-25 DIAGNOSIS — N6320 Unspecified lump in the left breast, unspecified quadrant: Secondary | ICD-10-CM

## 2022-03-28 DIAGNOSIS — M25571 Pain in right ankle and joints of right foot: Secondary | ICD-10-CM | POA: Diagnosis not present

## 2022-06-14 DIAGNOSIS — M1812 Unilateral primary osteoarthritis of first carpometacarpal joint, left hand: Secondary | ICD-10-CM | POA: Diagnosis not present

## 2022-06-23 DIAGNOSIS — M1812 Unilateral primary osteoarthritis of first carpometacarpal joint, left hand: Secondary | ICD-10-CM | POA: Diagnosis not present

## 2022-08-20 ENCOUNTER — Ambulatory Visit
Admission: EM | Admit: 2022-08-20 | Discharge: 2022-08-20 | Disposition: A | Discharge status: Home / Self Care | Payer: PPO

## 2022-08-20 DIAGNOSIS — R52 Pain, unspecified: Secondary | ICD-10-CM | POA: Diagnosis not present

## 2022-08-20 DIAGNOSIS — R519 Headache, unspecified: Secondary | ICD-10-CM | POA: Diagnosis not present

## 2022-08-20 DIAGNOSIS — R42 Dizziness and giddiness: Secondary | ICD-10-CM | POA: Diagnosis not present

## 2022-08-20 DIAGNOSIS — W57XXXA Bitten or stung by nonvenomous insect and other nonvenomous arthropods, initial encounter: Secondary | ICD-10-CM | POA: Diagnosis not present

## 2022-08-20 LAB — POCT URINALYSIS DIP (MANUAL ENTRY)
Bilirubin, UA: NEGATIVE
Blood, UA: NEGATIVE
Glucose, UA: NEGATIVE mg/dL
Ketones, POC UA: NEGATIVE mg/dL
Leukocytes, UA: NEGATIVE
Nitrite, UA: NEGATIVE
Protein Ur, POC: NEGATIVE mg/dL
Spec Grav, UA: <=1.005 — AB (ref 1.010–1.025)
Urobilinogen, UA: 0.2 E.U./dL
pH, UA: 5.5 (ref 5.0–8.0)

## 2022-08-20 MED ORDER — DOXYCYCLINE HYCLATE 100 MG PO CAPS: 100.0000 mg | ORAL_CAPSULE | Freq: Two times a day (BID) | ORAL | 0 refills | Status: AC

## 2022-08-20 NOTE — ED Triage Notes (Signed)
X6 days Pt states she has had a headache, dizziness, fever.  s

## 2022-08-20 NOTE — Discharge Instructions (Addendum)
Basic blood work is pending.  Will call if it is abnormal.  I recommend that you follow-up with health department and/or primary care doctor in the next few days as soon as possible.  Please go to the ER if any symptoms persist or worsen.

## 2022-08-20 NOTE — ED Provider Notes (Signed)
[facility]    CSN: [id number] Arrival date & time: 08/20/22  1254      History   Chief Complaint Chief Complaint  Patient presents with   Allergic Reaction    I have been bitten several times by biting flies. I'm dizzy, have been running a temperature and joints are achy.  I'm also shaky and somewhat unstable on my feet.  I think I'm experiencing a reaction to the bites. - Entered by patient    HPI Kristin Adams is a 67 y.o. female.   Patient presents with dizziness, headache, fever, generalized bodyaches, chills that started about 6 days ago.  Patient is concerned that symptoms are related to deer flies as she was bitten by several of them prior to symptoms starting.  [place] when she was bitten by them.  She states that she has been doing a lot of research online and is concerned that symptoms are related to tularemia.  Tmax at home was 101.  Has taken over-the-counter pain relievers for fever, pain, headache.  Reports that she had a severe headache when symptoms first started which resolved and then returned yesterday.  Headache is currently rated 5/10 on pain scale. Headache is present on the occipital portion of the head and the bilateral sides of the head.  Reports that she has a history of migraines but this does not feel similar.  Dizziness is intermittent and she feels that she is "unsteady on her feet".  She does have a few remaining bites to the right lower extremity.  Denies chest pain, shortness of breath, nausea, vomiting.  Denies any known sick contacts.  Patient denies any pertinent cardiac history.  Reports that she takes medication for anxiety and metoprolol to prevent migraines.   Allergic Reaction   Past Medical History:  Diagnosis Date   Anxiety    Arthritis    Cancer ([facility])    Breast Cancer   Complication of anesthesia    Headaches, cluster    IBS (irritable bowel  syndrome)    Mitral valve prolapse    PONV (postoperative nausea and vomiting)    Wears glasses     Patient Active Problem List   Diagnosis Date Noted   Ductal carcinoma in situ (DCIS) of right breast 11/20/2015   Breast cancer of upper-outer quadrant of left female breast ([facility]) 03/11/2015   Genetic testing 02/05/2015   H/O malignant neoplasm of breast 12/30/2014   Malignant neoplasm of upper-outer quadrant of female breast ([facility]) 12/30/2014   hx: breast cancer, Invasive mammary, left, receptor +, her2 - 01/07/2011    Past Surgical History:  Procedure Laterality Date   BREAST LUMPECTOMY  2005   BREAST RECONSTRUCTION WITH PLACEMENT OF TISSUE EXPANDER AND FLEX HD (ACELLULAR HYDRATED DERMIS) Bilateral 02/19/2015   Procedure: IMMEDIATE BILATERAL BREAST RECONSTRUCTION WITH PLACEMENT OF TISSUE EXPANDER AND FLEX HD (ACELLULAR HYDRATED DERMIS);  Surgeon: Alena Bills Dillingham, DO;  Location[facility];  Service: Plastics;  Laterality: Bilateral;   LIPOSUCTION Bilateral 06/10/2015   Procedure: LIPOSUCTION;  Surgeon: Peggye Form, DO;  Location: [facility];  Service: Plastics;  Laterality: Bilateral;   MASTECTOMY W/ SENTINEL NODE BIOPSY Right 02/19/2015   Procedure: RIGHT MASTECTOMY WITH SENTINEL LYMPH NODE BIOPSY;  Surgeon: Chevis Pretty III, MD;  Location: [facility];  Service: General;  Laterality: Right;   MASTECTOMY, PARTIAL Left 02/19/2015   Procedure: LEFT MASTECTOMY PROPHYLACTIC;  Surgeon:  Chevis Pretty III, MD;  Location: [facility];  Service: General;  Laterality: Left;   REMOVAL OF BILATERAL TISSUE EXPANDERS WITH PLACEMENT OF BILATERAL BREAST IMPLANTS Bilateral 06/10/2015   Procedure: REMOVAL OF BILATERAL TISSUE EXPANDERS WITH PLACEMENT OF BILATERAL BREAST IMPLANTS;  Surgeon: Peggye Form, DO;  Location: [facility];  Service: Plastics;  Laterality: Bilateral;   VAGINAL HYSTERECTOMY  2000   3 LAP PROC FOR ENDOMET    OB History   No obstetric history on file.       Home Medications    Prior to Admission medications   Medication Sig Start Date End Date Taking? Authorizing Provider  albuterol (VENTOLIN HFA) 108 (90 Base) MCG/ACT inhaler 2 puffs Inhalation every 4 hrs as needed 02/01/22  Yes [provider]  ALPRAZolam (XANAX) 0.5 MG tablet Take 0.5 mg by mouth at bedtime as needed for anxiety.    Yes [provider]  B Complex-C (SUPER B COMPLEX PO) Take 1 tablet by mouth daily.    Yes [provider]  Calcium Carbonate-Vitamin D (CALCIUM + D PO) Take 1,200 mg by mouth daily.     Yes [provider]  Cholecalciferol (VITAMIN D) 1000 UNITS capsule Take 1,000 Units by mouth daily.     Yes [provider]  doxycycline (VIBRAMYCIN) 100 MG capsule Take 1 capsule (100 mg total) by mouth 2 (two) times daily for 14 days. 08/20/22 09/03/22 Yes Wylan Gentzler, Acie Fredrickson, FNP  escitalopram (LEXAPRO) 20 MG tablet Take 20 mg by mouth daily.     Yes [provider]  meloxicam (MOBIC) 15 MG tablet Take 15 mg by mouth daily.   Yes [provider]  metoprolol (TOPROL-XL) 50 MG 24 hr tablet Take 25 mg by mouth daily.   Yes [provider]  Multiple Vitamin (MULTIVITAMIN PO) Take 1 tablet by mouth daily.    Yes [provider]  diphenhydrAMINE (BENADRYL) 25 mg capsule Take 25 mg by mouth every 8 (eight) hours as needed for itching or allergies.  12/31/14   Magrinat, Valentino Hue, MD    Family History Family History  Problem Relation Age of Onset   Heart disease Father    Cancer Mother 51       colon   Diabetes Brother    Heart disease Brother    Diabetes Sister    Breast cancer Sister 97   Kidney disease Sister    Melanoma Maternal Aunt    Lung cancer Maternal Aunt     Social History Social History   Tobacco Use   Smoking status: Every Day    Packs/day: 0.25    Years: 42.00    Additional pack years: 0.00    Total pack years: 10.50    Types: Cigarettes   Smokeless tobacco: Never   Vaping Use   Vaping Use: Never used  Substance Use Topics   Alcohol use: Not Currently    Alcohol/week: 3.0 standard drinks of alcohol    Types: 3 Standard drinks or equivalent per week    Comment: WINE    Drug use: No     Allergies   Corticosteroids, Dexamethasone, Egg-derived products, Prednisone, Tilactase, Adhesive [tape], Gabapentin, Codone [hydrocodone], Fludrocortisone, Peanut-containing drug products, and Shellfish allergy   Review of Systems Review of Systems Per HPI  Physical Exam Triage Vital Signs ED Triage Vitals  Enc Vitals Group     BP 08/20/22 1326 125/75     Pulse Rate 08/20/22 1326 69     Resp 08/20/22  1326 17     Temp 08/20/22 1326 98.3 F (36.8 C)     Temp Source 08/20/22 1326 Oral     SpO2 08/20/22 1326 96 %     Weight 08/20/22 1321 135 lb (61.2 kg)     Height 08/20/22 1321 5\' 4"  (1.626 m)     Head Circumference --      Peak Flow --      Pain Score 08/20/22 1321 6     Pain Loc --      Pain Edu? --      Excl. in [place]? --    No data found.  Updated Vital Signs BP 125/75 (BP Location: Right Arm)   Pulse 69   Temp 98.3 F (36.8 C) (Oral)   Resp 17   Ht 5\' 4"  (1.626 m)   Wt 135 lb (61.2 kg)   SpO2 96%   BMI 23.17 kg/m   Visual Acuity Right Eye Distance:   Left Eye Distance:   Bilateral Distance:    Right Eye Near:   Left Eye Near:    Bilateral Near:     Physical Exam Constitutional:      General: She is not in acute distress.    Appearance: Normal appearance. She is not toxic-appearing or diaphoretic.  HENT:     Head: Normocephalic and atraumatic.  Eyes:     Extraocular Movements: Extraocular movements intact.     Conjunctiva/sclera: Conjunctivae normal.     Pupils: Pupils are equal, round, and reactive to light.  Cardiovascular:     Rate and Rhythm: Normal rate and regular rhythm.     Pulses: Normal pulses.     Heart sounds: Normal heart sounds.  Pulmonary:     Effort: Pulmonary effort is normal. No respiratory distress.      Breath sounds: Normal breath sounds.  Abdominal:     General: Bowel sounds are normal. There is no distension.     Palpations: Abdomen is soft.     Tenderness: There is no abdominal tenderness.  Skin:    Comments: Patient has a few scattered, flat, very small erythematous areas consistent with bite marks present to right foot and lateral right ankle.   Neurological:     General: No focal deficit present.     Mental Status: She is alert and oriented to person, place, and time. Mental status is at baseline.     Cranial Nerves: Cranial nerves 2-12 are intact.     Sensory: Sensation is intact.     Motor: Motor function is intact.     Coordination: Coordination is intact.     Gait: Gait is intact.  Psychiatric:        Mood and Affect: Mood normal.        Behavior: Behavior normal.        Thought Content: Thought content normal.        Judgment: Judgment normal.      UC Treatments / Results  Labs (all labs ordered are listed, but only abnormal results are displayed) Labs Reviewed  POCT URINALYSIS DIP (MANUAL ENTRY) - Abnormal; Notable for the following components:      Result Value   Spec Grav, UA <=1.005 (*)    All other components within normal limits  CBC  COMPREHENSIVE METABOLIC PANEL  SEDIMENTATION RATE  C-REACTIVE PROTEIN    EKG   Radiology No results found.  Procedures Procedures (including critical care time)  Medications Ordered in UC Medications - No data to display  Initial Impression / Assessment and Plan / UC Course  I have reviewed the triage vital signs and the nursing notes.  Pertinent labs & imaging results that were available during my care of the patient were reviewed by me and considered in my medical decision making (see chart for details).     I am not sure exact etiology of patient's symptoms but full workup with limitations here in urgent care was completed.  Neuroexam and vital signs are stable so do not think that emergent evaluation or  CT imaging of the head is necessary at this time.  I have a low suspicion for tularemia and discussed with patient that I do not have access to testing for this here. Although, will err on the side of caution and treat with doxycycline for this.  Doxycycline will also treat any type of inflammatory skin reaction/cellulitis as well.  Although, there are no obvious signs of this on physical exam.  EKG was completed that was unremarkable.  UA was unremarkable.  Will obtain CMP, CBC, ESR, CRP.  Awaiting results.  Patient was advised to follow-up with health department and PCP as soon as possible for further evaluation and management.  Patient could simply have viral illness. She was also given strict ER precautions if any symptoms persist or worsen.  Patient verbalized understanding and was agreeable with plan. Final Clinical Impressions(s) / UC Diagnoses   Final diagnoses:  Dizziness and giddiness  Acute nonintractable headache, unspecified headache type  Generalized body aches  Insect bite, unspecified site, initial encounter     Discharge Instructions      Basic blood work is pending.  Will call if it is abnormal.  I recommend that you follow-up with health department and/or primary care doctor in the next few days as soon as possible.  Please go to the ER if any symptoms persist or worsen.    ED Prescriptions     Medication Sig Dispense Auth. Provider   doxycycline (VIBRAMYCIN) 100 MG capsule Take 1 capsule (100 mg total) by mouth 2 (two) times daily for 14 days. 28 capsule Tow, Acie Fredrickson, [place]      PDMP not reviewed this encounter.   Gustavus Bryant, [place] 08/20/22 [phone]

## 2022-08-21 LAB — COMPREHENSIVE METABOLIC PANEL
ALT: 23 IU/L (ref 0–32)
AST: 29 IU/L (ref 0–40)
Albumin: 4.7 g/dL (ref 3.9–4.9)
Alkaline Phosphatase: 76 IU/L (ref 44–121)
BUN/Creatinine Ratio: 20 (ref 12–28)
BUN: 19 mg/dL (ref 8–27)
Bilirubin Total: 0.6 mg/dL (ref 0.0–1.2)
CO2: 25 mmol/L (ref 20–29)
Calcium: 10.5 mg/dL — ABNORMAL HIGH (ref 8.7–10.3)
Chloride: 99 mmol/L (ref 96–106)
Creatinine, Ser: 0.95 mg/dL (ref 0.57–1.00)
Globulin, Total: 2.1 g/dL (ref 1.5–4.5)
Glucose: 87 mg/dL (ref 70–99)
Potassium: 4.4 mmol/L (ref 3.5–5.2)
Sodium: 139 mmol/L (ref 134–144)
Total Protein: 6.8 g/dL (ref 6.0–8.5)
eGFR: 66 mL/min/{1.73_m2} (ref >59)

## 2022-08-21 LAB — CBC
Hematocrit: 46.9 % — ABNORMAL HIGH (ref 34.0–46.6)
Hemoglobin: 15.5 g/dL (ref 11.1–15.9)
MCH: 33.6 pg — ABNORMAL HIGH (ref 26.6–33.0)
MCHC: 33 g/dL (ref 31.5–35.7)
MCV: 102 fL — ABNORMAL HIGH (ref 79–97)
Platelets: 144 10*3/uL — ABNORMAL LOW (ref 150–450)
RBC: 4.61 x10E6/uL (ref 3.77–5.28)
RDW: 12.2 % (ref 11.7–15.4)
WBC: 7.1 10*3/uL (ref 3.4–10.8)

## 2022-08-21 LAB — SEDIMENTATION RATE: Sed Rate: 2 mm/hr (ref 0–40)

## 2022-08-21 LAB — C-REACTIVE PROTEIN: CRP: <1 mg/L (ref 0–10)

## 2022-08-26 ENCOUNTER — Telehealth (HOSPITAL_COMMUNITY): Payer: Self-pay | Admitting: Emergency Medicine

## 2022-08-26 NOTE — Telephone Encounter (Signed)
Per Rolly Salter, APP please attempt patient again to encourage PCP f/u from recent visit.

## 2022-08-29 NOTE — Telephone Encounter (Signed)
Reviewed with patient, no questions at this time Assisted with mychart

## 2022-09-21 DIAGNOSIS — H903 Sensorineural hearing loss, bilateral: Secondary | ICD-10-CM | POA: Diagnosis not present

## 2022-10-06 ENCOUNTER — Other Ambulatory Visit: Payer: Self-pay | Admitting: Family Medicine

## 2022-10-06 ENCOUNTER — Ambulatory Visit
Admission: RE | Admit: 2022-10-06 | Discharge: 2022-10-06 | Disposition: A | Discharge status: Home / Self Care | Payer: PPO | Source: Ambulatory Visit | Attending: Family Medicine | Admitting: Family Medicine

## 2022-10-06 DIAGNOSIS — N6320 Unspecified lump in the left breast, unspecified quadrant: Secondary | ICD-10-CM

## 2022-10-06 DIAGNOSIS — N6325 Unspecified lump in the left breast, overlapping quadrants: Secondary | ICD-10-CM | POA: Diagnosis not present

## 2022-10-13 ENCOUNTER — Ambulatory Visit
Admission: RE | Admit: 2022-10-13 | Discharge: 2022-10-13 | Disposition: A | Discharge status: Home / Self Care | Payer: PPO | Source: Ambulatory Visit | Attending: Family Medicine | Admitting: Family Medicine

## 2022-10-13 DIAGNOSIS — N6325 Unspecified lump in the left breast, overlapping quadrants: Secondary | ICD-10-CM | POA: Diagnosis not present

## 2022-10-13 DIAGNOSIS — N6320 Unspecified lump in the left breast, unspecified quadrant: Secondary | ICD-10-CM

## 2022-10-13 DIAGNOSIS — N641 Fat necrosis of breast: Secondary | ICD-10-CM | POA: Diagnosis not present

## 2022-10-13 HISTORY — PX: BREAST BIOPSY: SHX20

## 2022-10-20 DIAGNOSIS — M47816 Spondylosis without myelopathy or radiculopathy, lumbar region: Secondary | ICD-10-CM | POA: Diagnosis not present

## 2022-10-20 DIAGNOSIS — M5416 Radiculopathy, lumbar region: Secondary | ICD-10-CM | POA: Diagnosis not present

## 2022-12-02 DIAGNOSIS — M47816 Spondylosis without myelopathy or radiculopathy, lumbar region: Secondary | ICD-10-CM | POA: Diagnosis not present

## 2022-12-02 DIAGNOSIS — M5416 Radiculopathy, lumbar region: Secondary | ICD-10-CM | POA: Diagnosis not present

## 2022-12-28 DIAGNOSIS — M4726 Other spondylosis with radiculopathy, lumbar region: Secondary | ICD-10-CM | POA: Diagnosis not present

## 2022-12-30 DIAGNOSIS — M4726 Other spondylosis with radiculopathy, lumbar region: Secondary | ICD-10-CM | POA: Diagnosis not present

## 2023-01-02 DIAGNOSIS — M4726 Other spondylosis with radiculopathy, lumbar region: Secondary | ICD-10-CM | POA: Diagnosis not present

## 2023-01-05 DIAGNOSIS — M4726 Other spondylosis with radiculopathy, lumbar region: Secondary | ICD-10-CM | POA: Diagnosis not present

## 2023-01-11 DIAGNOSIS — M4726 Other spondylosis with radiculopathy, lumbar region: Secondary | ICD-10-CM | POA: Diagnosis not present

## 2023-03-29 ENCOUNTER — Other Ambulatory Visit: Payer: Self-pay | Admitting: Family Medicine

## 2023-03-29 DIAGNOSIS — M8580 Other specified disorders of bone density and structure, unspecified site: Secondary | ICD-10-CM

## 2023-04-10 ENCOUNTER — Telehealth: Payer: Self-pay

## 2023-04-10 DIAGNOSIS — Z122 Encounter for screening for malignant neoplasm of respiratory organs: Secondary | ICD-10-CM

## 2023-04-10 DIAGNOSIS — F17210 Nicotine dependence, cigarettes, uncomplicated: Secondary | ICD-10-CM

## 2023-04-10 DIAGNOSIS — Z87891 Personal history of nicotine dependence: Secondary | ICD-10-CM

## 2023-04-10 NOTE — Telephone Encounter (Signed)
Lung Cancer Screening Narrative/Criteria Questionnaire (Cigarette Smokers Only- No Cigars/Pipes/vapes)   Kristin Adams   SDMV:2/14/205 @ 10:30am with Keturah Barre, RN   1955/03/20   LDCT: 04/25/2023 at 1:00pm at [facility]    68 y.o.   Phone: [phone]  Lung Screening Narrative (confirm age 52-77 yrs Medicare / 50-80 yrs Private pay insurance)   Insurance information:HT[employer]   Referring Provider:Dr. Clelia Croft   This screening involves an initial phone call with a team member from our program. It is called a shared decision making visit. The initial meeting is required by  insurance and Medicare to make sure you understand the program. This appointment takes about 15-20 minutes to complete. You will complete the screening scan at your scheduled date/time.  This scan takes about 5-10 minutes to complete. You can eat and drink normally before and after the scan.  Criteria questions for Lung Cancer Screening:   Are you a current or former smoker? Current Age began smoking: 70   If you are a former smoker, what year did you quit smoking? (within 15 yrs)   To calculate your smoking history, I need an accurate estimate of how many packs of cigarettes you smoked per day and for how many years. (Not just the number of PPD you are now smoking)   Years smoking 50 x Packs per day 2 = Pack years 100   (at least 20 pack yrs)   (Make sure they understand that we need to know how much they have smoked in the past, not just the number of PPD they are smoking now)  Do you have a personal history of cancer?  Yes - (type and when diagnosed - 5 yrs cancer free) Breast 2006, 2016    Do you have a family history of cancer? Yes  (cancer type and and relative) sister-breast mother colon  Are you coughing up blood?  No  Have you had unexplained weight loss of 15 lbs or more in the last 6 months? No  It looks like you meet all criteria.  When would be a good time for [place] to schedule you for this  screening?   Additional information:

## 2023-04-21 ENCOUNTER — Ambulatory Visit (INDEPENDENT_AMBULATORY_CARE_PROVIDER_SITE_OTHER): Payer: PPO | Admitting: Acute Care

## 2023-04-21 DIAGNOSIS — F17210 Nicotine dependence, cigarettes, uncomplicated: Secondary | ICD-10-CM

## 2023-04-21 NOTE — Patient Instructions (Signed)

## 2023-04-21 NOTE — Progress Notes (Signed)
 Provider Attestation I agree with the documentation of the Shared Decision Making visit,  smoking cessation counseling if appropriate, and verification or eligibility for lung cancer screening as documented by the RN Nurse Navigator.   Raejean Bullock, MSN, AGACNP-BC [facility] See Amion for personal pager PCCM on call pager [phone]      Virtual Visit via Telephone Note  I connected with Kristin Adams on 04/21/23 at 10:30 AM EST by telephone and verified that I am speaking with the correct person using two identifiers.  Location: Patient: in home Provider: [address] [place], [place], [address]   Shared Decision Making Visit Lung Cancer Screening Program [phone])   Eligibility: Age 68 y.o. Pack Years Smoking History Calculation 100 (# packs/per year x # years smoked) Recent History of coughing up blood  no Unexplained weight loss? no ( >Than 15 pounds within the last 6 months ) Prior History Lung / other cancer no (Diagnosis within the last 5 years already requiring surveillance chest CT Scans). Smoking Status Current Smoker Former Smokers: Years since quit: na  Quit Date: na  Visit Components: Discussion included one or more decision making aids. yes Discussion included risk/benefits of screening. yes Discussion included potential follow up diagnostic testing for abnormal scans. yes Discussion included meaning and risk of over diagnosis. yes Discussion included meaning and risk of False Positives. yes Discussion included meaning of total radiation exposure. yes  Counseling Included: Importance of adherence to annual lung cancer LDCT screening. yes Impact of comorbidities on ability to participate in the program. yes Ability and willingness to under diagnostic treatment. yes  Smoking Cessation Counseling: Current Smokers:  Discussed importance of smoking cessation. yes Information about tobacco cessation classes and  interventions provided to patient. yes Patient provided with "ticket" for LDCT Scan. yes Symptomatic Patient. no  Counselingnq Diagnosis Code: Tobacco Use Z72.0 Asymptomatic Patient yes  Counseling (Intermediate counseling: > three minutes counseling) W2956 Former Smokers:  Discussed the importance of maintaining cigarette abstinence. yes Diagnosis Code: Personal History of Nicotine Dependence. O13.086 Information about tobacco cessation classes and interventions provided to patient. Yes Patient provided with "ticket" for LDCT Scan. yes Written Order for Lung Cancer Screening with LDCT placed in Epic. Yes (CT Chest Lung Cancer Screening Low Dose W/O CM) [id number] Z12.2-Screening of respiratory organs Z87.891-Personal history of nicotine dependence   Valentin Gaskins, RN 04/21/23

## 2023-04-24 ENCOUNTER — Ambulatory Visit: Payer: PPO | Admitting: Podiatry

## 2023-04-24 ENCOUNTER — Encounter: Payer: Self-pay | Admitting: Podiatry

## 2023-04-24 DIAGNOSIS — D2371 Other benign neoplasm of skin of right lower limb, including hip: Secondary | ICD-10-CM | POA: Diagnosis not present

## 2023-04-24 NOTE — Progress Notes (Signed)
Chief Complaint  Patient presents with   Plantar Warts    Patient states she believes she has a plantar wart on her right foot in the middle, patient states that it is uncomfortable     Subjective: 68 y.o. female presenting to the office today for evaluation of symptomatic skin lesion to the plantar aspect of the right forefoot.  Ongoing for about 1 year now.  If she tries to clip the lesion out herself which gives her some temporary relief   Past Medical History:  Diagnosis Date   Anxiety    Arthritis    Cancer (HCC)    Breast Cancer   Complication of anesthesia    Headaches, cluster    IBS (irritable bowel syndrome)    Mitral valve prolapse    PONV (postoperative nausea and vomiting)    Wears glasses     Past Surgical History:  Procedure Laterality Date   BREAST BIOPSY Left 10/13/2022   [place] LT BREAST BX W LOC DEV 1ST LESION IMG BX SPEC US GUIDE 10/13/2022 [facility]   BREAST LUMPECTOMY  2005   BREAST RECONSTRUCTION WITH PLACEMENT OF TISSUE EXPANDER AND FLEX HD (ACELLULAR HYDRATED DERMIS) Bilateral 02/19/2015   Procedure: IMMEDIATE BILATERAL BREAST RECONSTRUCTION WITH PLACEMENT OF TISSUE EXPANDER AND FLEX HD (ACELLULAR HYDRATED DERMIS);  Surgeon: Alena Bills Dillingham, DO;  Location: [facility];  Service: Plastics;  Laterality: Bilateral;   LIPOSUCTION Bilateral 06/10/2015   Procedure: LIPOSUCTION;  Surgeon: Peggye Form, DO;  Location: [facility];  Service: Plastics;  Laterality: Bilateral;   MASTECTOMY W/ SENTINEL NODE BIOPSY Right 02/19/2015   Procedure: RIGHT MASTECTOMY WITH SENTINEL LYMPH NODE BIOPSY;  Surgeon: Chevis Pretty III, MD;  Location: [facility];  Service: General;  Laterality: Right;   MASTECTOMY, PARTIAL Left 02/19/2015   Procedure: LEFT MASTECTOMY PROPHYLACTIC;  Surgeon: Chevis Pretty III, MD;  Location: [facility];  Service: General;  Laterality: Left;   REMOVAL OF BILATERAL TISSUE EXPANDERS WITH PLACEMENT OF BILATERAL BREAST IMPLANTS Bilateral 06/10/2015    Procedure: REMOVAL OF BILATERAL TISSUE EXPANDERS WITH PLACEMENT OF BILATERAL BREAST IMPLANTS;  Surgeon: Peggye Form, DO;  Location: [facility];  Service: Plastics;  Laterality: Bilateral;   VAGINAL HYSTERECTOMY  2000   3 LAP PROC FOR ENDOMET    Allergies  Allergen Reactions   Corticosteroids Hives, Shortness Of Breath, Swelling and Rash   Dexamethasone Hives and Swelling   Egg-Derived Products Hives and Swelling   Prednisone Hives and Swelling   Tilactase Anaphylaxis   Adhesive [Tape] Rash    blisters   Gabapentin Other (See Comments)   Codone [Hydrocodone] Rash   Fludrocortisone Rash   Peanut-Containing Drug Products Rash    "suspected allergy"   Shellfish Allergy Rash    "suspected list"     Objective:  Physical Exam General: Alert and oriented x3 in no acute distress  Dermatology: Hyperkeratotic lesion(s) present on the plantar aspect of the right forefoot. Pain on palpation with a central nucleated core noted. Skin is warm, dry and supple bilateral lower extremities. Negative for open lesions or macerations.  Vascular: Palpable pedal pulses bilaterally. No edema or erythema noted. Capillary refill within normal limits.  Neurological: Grossly intact via light touch  Musculoskeletal Exam: Pain on palpation at the keratotic lesion(s) noted. Range of motion within normal limits bilateral. Muscle strength 5/5 in all groups bilateral.  Assessment: 1.  Symptomatic benign skin lesion 2.  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  -Salicylic acid applied with a bandaid.  Recommend salicylic acid daily -Today we did discuss the recurrence of the nail plate to the left great toe.  At some point she states that she would like to have the procedure performed  again to remove the remaining portion of the nail that regrew. 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 Felecia Shelling, DPM    [address] [place], [place] [address]                Office [phone]  Fax [phone]

## 2023-04-25 ENCOUNTER — Ambulatory Visit
Admission: RE | Admit: 2023-04-25 | Discharge: 2023-04-25 | Disposition: A | Discharge status: Home / Self Care | Payer: PPO | Source: Ambulatory Visit | Attending: Family Medicine | Admitting: Family Medicine

## 2023-04-25 DIAGNOSIS — Z122 Encounter for screening for malignant neoplasm of respiratory organs: Secondary | ICD-10-CM

## 2023-04-25 DIAGNOSIS — F17210 Nicotine dependence, cigarettes, uncomplicated: Secondary | ICD-10-CM | POA: Diagnosis not present

## 2023-04-25 DIAGNOSIS — Z87891 Personal history of nicotine dependence: Secondary | ICD-10-CM

## 2023-05-12 ENCOUNTER — Other Ambulatory Visit: Payer: Self-pay

## 2023-05-12 DIAGNOSIS — F17210 Nicotine dependence, cigarettes, uncomplicated: Secondary | ICD-10-CM

## 2023-05-12 DIAGNOSIS — Z87891 Personal history of nicotine dependence: Secondary | ICD-10-CM

## 2023-05-12 DIAGNOSIS — Z122 Encounter for screening for malignant neoplasm of respiratory organs: Secondary | ICD-10-CM

## 2023-05-16 ENCOUNTER — Other Ambulatory Visit (HOSPITAL_COMMUNITY): Payer: Self-pay | Admitting: Family Medicine

## 2023-05-16 DIAGNOSIS — I2510 Atherosclerotic heart disease of native coronary artery without angina pectoris: Secondary | ICD-10-CM

## 2023-05-25 ENCOUNTER — Ambulatory Visit (HOSPITAL_COMMUNITY)
Admission: RE | Admit: 2023-05-25 | Discharge: 2023-05-25 | Disposition: A | Discharge status: Home / Self Care | Payer: Self-pay | Source: Ambulatory Visit | Attending: Family Medicine | Admitting: Family Medicine

## 2023-05-25 DIAGNOSIS — I2510 Atherosclerotic heart disease of native coronary artery without angina pectoris: Secondary | ICD-10-CM | POA: Insufficient documentation

## 2023-05-29 ENCOUNTER — Ambulatory Visit: Payer: PPO | Admitting: Podiatry

## 2023-08-11 DIAGNOSIS — I2510 Atherosclerotic heart disease of native coronary artery without angina pectoris: Secondary | ICD-10-CM | POA: Diagnosis not present

## 2023-09-04 DIAGNOSIS — I2510 Atherosclerotic heart disease of native coronary artery without angina pectoris: Secondary | ICD-10-CM | POA: Diagnosis not present

## 2023-09-04 DIAGNOSIS — M1990 Unspecified osteoarthritis, unspecified site: Secondary | ICD-10-CM | POA: Diagnosis not present

## 2023-09-04 DIAGNOSIS — Z853 Personal history of malignant neoplasm of breast: Secondary | ICD-10-CM | POA: Diagnosis not present

## 2023-09-04 DIAGNOSIS — F3342 Major depressive disorder, recurrent, in full remission: Secondary | ICD-10-CM | POA: Diagnosis not present

## 2023-09-09 DIAGNOSIS — I2510 Atherosclerotic heart disease of native coronary artery without angina pectoris: Secondary | ICD-10-CM | POA: Diagnosis not present

## 2023-10-05 DIAGNOSIS — Z853 Personal history of malignant neoplasm of breast: Secondary | ICD-10-CM | POA: Diagnosis not present

## 2023-10-05 DIAGNOSIS — F3342 Major depressive disorder, recurrent, in full remission: Secondary | ICD-10-CM | POA: Diagnosis not present

## 2023-10-05 DIAGNOSIS — I2510 Atherosclerotic heart disease of native coronary artery without angina pectoris: Secondary | ICD-10-CM | POA: Diagnosis not present

## 2023-10-05 DIAGNOSIS — M1990 Unspecified osteoarthritis, unspecified site: Secondary | ICD-10-CM | POA: Diagnosis not present

## 2023-10-09 DIAGNOSIS — I2510 Atherosclerotic heart disease of native coronary artery without angina pectoris: Secondary | ICD-10-CM | POA: Diagnosis not present

## 2023-10-20 DIAGNOSIS — H2513 Age-related nuclear cataract, bilateral: Secondary | ICD-10-CM | POA: Diagnosis not present

## 2023-10-20 DIAGNOSIS — H40013 Open angle with borderline findings, low risk, bilateral: Secondary | ICD-10-CM | POA: Diagnosis not present

## 2023-10-31 ENCOUNTER — Other Ambulatory Visit: Payer: Self-pay | Admitting: Family Medicine

## 2023-10-31 DIAGNOSIS — M8588 Other specified disorders of bone density and structure, other site: Secondary | ICD-10-CM

## 2023-11-05 DIAGNOSIS — I2510 Atherosclerotic heart disease of native coronary artery without angina pectoris: Secondary | ICD-10-CM | POA: Diagnosis not present

## 2023-11-05 DIAGNOSIS — F3342 Major depressive disorder, recurrent, in full remission: Secondary | ICD-10-CM | POA: Diagnosis not present

## 2023-11-05 DIAGNOSIS — Z853 Personal history of malignant neoplasm of breast: Secondary | ICD-10-CM | POA: Diagnosis not present

## 2023-11-05 DIAGNOSIS — M1990 Unspecified osteoarthritis, unspecified site: Secondary | ICD-10-CM | POA: Diagnosis not present

## 2023-11-14 ENCOUNTER — Other Ambulatory Visit: Payer: PPO

## 2023-11-14 ENCOUNTER — Other Ambulatory Visit

## 2023-12-04 ENCOUNTER — Ambulatory Visit
Admission: RE | Admit: 2023-12-04 | Discharge: 2023-12-04 | Disposition: A | Discharge status: Home / Self Care | Source: Ambulatory Visit | Attending: Family Medicine | Admitting: Family Medicine

## 2023-12-04 ENCOUNTER — Other Ambulatory Visit

## 2023-12-04 DIAGNOSIS — M8580 Other specified disorders of bone density and structure, unspecified site: Secondary | ICD-10-CM | POA: Diagnosis not present

## 2023-12-04 DIAGNOSIS — Z780 Asymptomatic menopausal state: Secondary | ICD-10-CM | POA: Diagnosis not present

## 2023-12-04 DIAGNOSIS — M8588 Other specified disorders of bone density and structure, other site: Secondary | ICD-10-CM | POA: Diagnosis not present
